# Patient Record
Sex: Male | Born: 1970 | Race: White | Hispanic: No | Marital: Married | Smoking: Never smoker
Health system: Southern US, Community
[De-identification: ages and names within clinical notes are randomized; demographics above are authoritative.]

## PROBLEM LIST (undated history)

## (undated) DIAGNOSIS — K219 Gastro-esophageal reflux disease without esophagitis: Secondary | ICD-10-CM

## (undated) DIAGNOSIS — L570 Actinic keratosis: Secondary | ICD-10-CM

## (undated) DIAGNOSIS — E291 Testicular hypofunction: Secondary | ICD-10-CM

## (undated) DIAGNOSIS — D649 Anemia, unspecified: Secondary | ICD-10-CM

## (undated) DIAGNOSIS — I10 Essential (primary) hypertension: Secondary | ICD-10-CM

## (undated) HISTORY — PX: TONSILLECTOMY: SHX5217

## (undated) HISTORY — DX: Gastro-esophageal reflux disease without esophagitis: K21.9

## (undated) HISTORY — DX: Actinic keratosis: L57.0

## (undated) HISTORY — DX: Testicular hypofunction: E29.1

## (undated) HISTORY — PX: TYMPANOSTOMY TUBE PLACEMENT: SHX32

## (undated) HISTORY — DX: Anemia, unspecified: D64.9

## (undated) HISTORY — DX: Essential (primary) hypertension: I10

---

## 2014-11-17 ENCOUNTER — Other Ambulatory Visit: Payer: Self-pay | Admitting: Family Medicine

## 2015-01-28 ENCOUNTER — Other Ambulatory Visit: Payer: Self-pay | Admitting: Family Medicine

## 2015-03-31 ENCOUNTER — Ambulatory Visit: Payer: Self-pay | Admitting: Family Medicine

## 2015-04-21 DIAGNOSIS — E291 Testicular hypofunction: Secondary | ICD-10-CM | POA: Insufficient documentation

## 2015-04-21 DIAGNOSIS — D649 Anemia, unspecified: Secondary | ICD-10-CM | POA: Insufficient documentation

## 2015-04-21 DIAGNOSIS — K219 Gastro-esophageal reflux disease without esophagitis: Secondary | ICD-10-CM | POA: Insufficient documentation

## 2015-04-21 DIAGNOSIS — I10 Essential (primary) hypertension: Secondary | ICD-10-CM | POA: Insufficient documentation

## 2015-04-23 ENCOUNTER — Ambulatory Visit (INDEPENDENT_AMBULATORY_CARE_PROVIDER_SITE_OTHER): Payer: 59 | Admitting: Family Medicine

## 2015-04-23 ENCOUNTER — Encounter: Payer: Self-pay | Admitting: Family Medicine

## 2015-04-23 VITALS — BP 121/64 | HR 65 | Temp 98.0°F | Ht 76.7 in | Wt 214.0 lb

## 2015-04-23 DIAGNOSIS — D649 Anemia, unspecified: Secondary | ICD-10-CM

## 2015-04-23 DIAGNOSIS — Z23 Encounter for immunization: Secondary | ICD-10-CM

## 2015-04-23 DIAGNOSIS — E291 Testicular hypofunction: Secondary | ICD-10-CM | POA: Diagnosis not present

## 2015-04-23 DIAGNOSIS — I10 Essential (primary) hypertension: Secondary | ICD-10-CM

## 2015-04-23 MED ORDER — FLUTICASONE PROPIONATE 50 MCG/ACT NA SUSP: 2.0000 | Freq: Every day | NASAL | Status: DC

## 2015-04-23 MED ORDER — TESTOSTERONE CYPIONATE 200 MG/ML IM SOLN: INTRAMUSCULAR | Status: DC

## 2015-04-23 MED ORDER — AMLODIPINE BESYLATE 5 MG PO TABS: 5.0000 mg | ORAL_TABLET | Freq: Every day | ORAL | Status: DC

## 2015-04-23 NOTE — Assessment & Plan Note (Signed)
The current medical regimen is effective;  continue present plan and medications.  

## 2015-04-23 NOTE — Progress Notes (Signed)
   BP 121/64 mmHg  Pulse 65  Temp(Src) 98 F (36.7 C)  Ht 6' 4.7" (1.948 m)  Wt 214 lb (97.07 kg)  BMI 25.58 kg/m2  SpO2 98%   Subjective:    Patient ID: Dustin [name], male    DOB: 1970-10-19, 44 y.o.   MRN: [id number]  HPI: Dustin [name] is a 44 y.o. male  Chief Complaint  Patient presents with  . Hypertension  . Hypogonadism   patient doing well good blood pressure control no complaints from medications takes faithfully Testosterone patient's doing self injections without problems taking 200 mg per mL every 2 weeks is controlling his symptoms well without problems or concerns  Relevant past medical, surgical, family and social history reviewed and updated as indicated. Interim medical history since our last visit reviewed. Allergies and medications reviewed and updated.  Review of Systems  Constitutional: Negative.   Respiratory: Negative.   Cardiovascular: Negative.     Per HPI unless specifically indicated above     Objective:    BP 121/64 mmHg  Pulse 65  Temp(Src) 98 F (36.7 C)  Ht 6' 4.7" (1.948 m)  Wt 214 lb (97.07 kg)  BMI 25.58 kg/m2  SpO2 98%  Wt Readings from Last 3 Encounters:  04/23/15 214 lb (97.07 kg)  09/23/14 216 lb (97.977 kg)    Physical Exam  Constitutional: He is oriented to person, place, and time. He appears well-developed and well-nourished. No distress.  HENT:  Head: Normocephalic and atraumatic.  Right Ear: Hearing normal.  Left Ear: Hearing normal.  Nose: Nose normal.  Eyes: Conjunctivae and lids are normal. Right eye exhibits no discharge. Left eye exhibits no discharge. No scleral icterus.  Cardiovascular: Normal rate and regular rhythm.   Pulmonary/Chest: Effort normal and breath sounds normal. No respiratory distress.  Musculoskeletal: Normal range of motion.  Neurological: He is alert and oriented to person, place, and time.  Skin: Skin is intact. No rash noted.  Psychiatric: He has a normal mood and affect. His speech is  normal and behavior is normal. Judgment and thought content normal. Cognition and memory are normal.    No results found for this or any previous visit.    Assessment & Plan:   Problem List Items Addressed This Visit      Cardiovascular and Mediastinum   Hypertension    The current medical regimen is effective;  continue present plan and medications.       Relevant Medications   amLODipine (NORVASC) 5 MG tablet     Endocrine   Male hypogonadism    The current medical regimen is effective;  continue present plan and medications.       Relevant Orders   PSA   Testosterone    Other Visit Diagnoses    Essential hypertension, benign    -  Primary    Relevant Medications    amLODipine (NORVASC) 5 MG tablet    Other Relevant Orders    Comprehensive metabolic panel    Anemia, unspecified        Relevant Orders    CBC with Differential/Platelet    Immunization due        Relevant Orders    Flu Vaccine QUAD 36+ mos PF IM (Fluarix & Fluzone Quad PF)        Follow up plan: Return in about 6 months (around 10/21/2015), or if symptoms worsen or fail to improve, for Physical Exam.

## 2015-04-24 LAB — COMPREHENSIVE METABOLIC PANEL
ALK PHOS: 43 IU/L (ref 39–117)
ALT: 27 IU/L (ref 0–44)
AST: 65 IU/L — AB (ref 0–40)
Albumin/Globulin Ratio: 1.8 (ref 1.1–2.5)
Albumin: 4.6 g/dL (ref 3.5–5.5)
BILIRUBIN TOTAL: 0.5 mg/dL (ref 0.0–1.2)
BUN/Creatinine Ratio: 10 (ref 9–20)
BUN: 15 mg/dL (ref 6–24)
CHLORIDE: 96 mmol/L — AB (ref 97–106)
CO2: 26 mmol/L (ref 18–29)
Calcium: 9.6 mg/dL (ref 8.7–10.2)
Creatinine, Ser: 1.45 mg/dL — ABNORMAL HIGH (ref 0.76–1.27)
GFR calc Af Amer: 68 mL/min/{1.73_m2} (ref >59)
GFR calc non Af Amer: 59 mL/min/{1.73_m2} — ABNORMAL LOW (ref >59)
GLUCOSE: 82 mg/dL (ref 65–99)
Globulin, Total: 2.5 g/dL (ref 1.5–4.5)
Potassium: 4.6 mmol/L (ref 3.5–5.2)
Sodium: 137 mmol/L (ref 136–144)
Total Protein: 7.1 g/dL (ref 6.0–8.5)

## 2015-04-24 LAB — CBC WITH DIFFERENTIAL/PLATELET
BASOS ABS: 0 10*3/uL (ref 0.0–0.2)
Basos: 0 %
EOS (ABSOLUTE): 0.2 10*3/uL (ref 0.0–0.4)
Eos: 4 %
HEMOGLOBIN: 15.1 g/dL (ref 12.6–17.7)
Hematocrit: 44.9 % (ref 37.5–51.0)
Immature Grans (Abs): 0 10*3/uL (ref 0.0–0.1)
Immature Granulocytes: 0 %
LYMPHS ABS: 1.9 10*3/uL (ref 0.7–3.1)
LYMPHS: 31 %
MCH: 29.4 pg (ref 26.6–33.0)
MCHC: 33.6 g/dL (ref 31.5–35.7)
MCV: 88 fL (ref 79–97)
Monocytes Absolute: 0.5 10*3/uL (ref 0.1–0.9)
Monocytes: 8 %
Neutrophils Absolute: 3.5 10*3/uL (ref 1.4–7.0)
Neutrophils: 57 %
PLATELETS: 221 10*3/uL (ref 150–379)
RBC: 5.13 x10E6/uL (ref 4.14–5.80)
RDW: 16.1 % — ABNORMAL HIGH (ref 12.3–15.4)
WBC: 6.1 10*3/uL (ref 3.4–10.8)

## 2015-04-24 LAB — TESTOSTERONE: TESTOSTERONE: 234 ng/dL — AB (ref 348–1197)

## 2015-04-24 LAB — PSA: PROSTATE SPECIFIC AG, SERUM: 0.4 ng/mL (ref 0.0–4.0)

## 2015-04-27 ENCOUNTER — Encounter: Payer: Self-pay | Admitting: Family Medicine

## 2015-04-28 ENCOUNTER — Telehealth: Payer: Self-pay | Admitting: Family Medicine

## 2015-04-28 NOTE — Telephone Encounter (Signed)
Called patient with results, he will get letter in the mail

## 2015-04-28 NOTE — Telephone Encounter (Signed)
Pt looking for lab results

## 2015-05-05 ENCOUNTER — Telehealth: Payer: Self-pay

## 2015-05-05 NOTE — Telephone Encounter (Signed)
Discussed with patient nothing to worry about.

## 2015-05-05 NOTE — Telephone Encounter (Signed)
Call pt 

## 2015-05-05 NOTE — Telephone Encounter (Signed)
Patient got his labs in the mail, he noticed his AST is elevated and wants to make sure there is nothing he needs to be doing to address this?  I check PP, and it has gone up from his last check in April , it was 62

## 2015-10-21 ENCOUNTER — Encounter: Payer: Self-pay | Admitting: Family Medicine

## 2015-10-21 ENCOUNTER — Ambulatory Visit (INDEPENDENT_AMBULATORY_CARE_PROVIDER_SITE_OTHER): Payer: 59 | Admitting: Family Medicine

## 2015-10-21 VITALS — BP 133/74 | HR 65 | Temp 98.4°F | Ht 77.3 in | Wt 217.0 lb

## 2015-10-21 DIAGNOSIS — Z0000 Encounter for general adult medical examination without abnormal findings: Secondary | ICD-10-CM

## 2015-10-21 DIAGNOSIS — I10 Essential (primary) hypertension: Secondary | ICD-10-CM

## 2015-10-21 DIAGNOSIS — K219 Gastro-esophageal reflux disease without esophagitis: Secondary | ICD-10-CM | POA: Diagnosis not present

## 2015-10-21 DIAGNOSIS — E291 Testicular hypofunction: Secondary | ICD-10-CM | POA: Diagnosis not present

## 2015-10-21 LAB — URINALYSIS, ROUTINE W REFLEX MICROSCOPIC
BILIRUBIN UA: NEGATIVE
Glucose, UA: NEGATIVE
KETONES UA: NEGATIVE
Leukocytes, UA: NEGATIVE
NITRITE UA: NEGATIVE
Protein, UA: NEGATIVE
RBC, UA: NEGATIVE
Specific Gravity, UA: <1.005 — ABNORMAL LOW (ref 1.005–1.030)
UUROB: 0.2 mg/dL (ref 0.2–1.0)
pH, UA: 5 (ref 5.0–7.5)

## 2015-10-21 MED ORDER — AMLODIPINE BESYLATE 5 MG PO TABS: 5.0000 mg | ORAL_TABLET | Freq: Every day | ORAL | Status: DC

## 2015-10-21 MED ORDER — TESTOSTERONE CYPIONATE 200 MG/ML IM SOLN: INTRAMUSCULAR | Status: DC

## 2015-10-21 NOTE — Assessment & Plan Note (Signed)
The current medical regimen is effective;  continue present plan and medications.  

## 2015-10-21 NOTE — Addendum Note (Signed)
Addended by: Rowe Clack H on: 10/21/2015 01:44 PM   Modules accepted: Miquel Dunn

## 2015-10-21 NOTE — Addendum Note (Signed)
Addended byGolden Pop on: 10/21/2015 01:44 PM   Modules accepted: Orders, SmartSet

## 2015-10-21 NOTE — Progress Notes (Signed)
BP 133/74 mmHg  Pulse 65  Temp(Src) 98.4 F (36.9 C)  Ht 6' 5.3" (1.963 m)  Wt 217 lb (98.431 kg)  BMI 25.54 kg/m2  SpO2 98%   Subjective:    Patient ID: Dustin [name], male    DOB: 06/29/1970, 45 y.o.   MRN: [id number]  HPI: Dustin [name] is a 45 y.o. male  Chief Complaint  Patient presents with  . Annual Exam  Patient's doing well with no complaints blood pressure good control no issues no side effects medications. Taking testosterone 1 cc 200 mg every 2 weeks with no issues self administers Nexium when necessary And Propecia Some left thumb soreness weakness continues real change over the last year. No numbness tingling  Relevant past medical, surgical, family and social history reviewed and updated as indicated. Interim medical history since our last visit reviewed. Allergies and medications reviewed and updated.  Review of Systems  Constitutional: Negative.   HENT: Negative.   Eyes: Negative.   Respiratory: Negative.   Cardiovascular: Negative.   Gastrointestinal: Negative.   Endocrine: Negative.   Genitourinary: Negative.   Musculoskeletal: Negative.   Skin: Negative.   Allergic/Immunologic: Negative.   Neurological: Negative.   Hematological: Negative.   Psychiatric/Behavioral: Negative.     Per HPI unless specifically indicated above     Objective:    BP 133/74 mmHg  Pulse 65  Temp(Src) 98.4 F (36.9 C)  Ht 6' 5.3" (1.963 m)  Wt 217 lb (98.431 kg)  BMI 25.54 kg/m2  SpO2 98%  Wt Readings from Last 3 Encounters:  10/21/15 217 lb (98.431 kg)  04/23/15 214 lb (97.07 kg)  09/23/14 216 lb (97.977 kg)    Physical Exam  Constitutional: He is oriented to person, place, and time. He appears well-developed and well-nourished.  HENT:  Head: Normocephalic and atraumatic.  Right Ear: External ear normal.  Left Ear: External ear normal.  Eyes: Conjunctivae and EOM are normal. Pupils are equal, round, and reactive to light.  Neck: Normal range of motion.  Neck supple.  Cardiovascular: Normal rate, regular rhythm, normal heart sounds and intact distal pulses.   Pulmonary/Chest: Effort normal and breath sounds normal.  Abdominal: Soft. Bowel sounds are normal. There is no splenomegaly or hepatomegaly.  Genitourinary: Rectum normal, prostate normal and penis normal.  Musculoskeletal: Normal range of motion.  Neurological: He is alert and oriented to person, place, and time. He has normal reflexes.  Skin: No rash noted. No erythema.  Psychiatric: He has a normal mood and affect. His behavior is normal. Judgment and thought content normal.    Results for orders placed or performed in visit on 04/23/15  CBC with Differential/Platelet  Result Value Ref Range   WBC 6.1 3.4 - 10.8 x10E3/uL   RBC 5.13 4.14 - 5.80 x10E6/uL   Hemoglobin 15.1 12.6 - 17.7 g/dL   Hematocrit 44.9 37.5 - 51.0 %   MCV 88 79 - 97 fL   MCH 29.4 26.6 - 33.0 pg   MCHC 33.6 31.5 - 35.7 g/dL   RDW 16.1 (H) 12.3 - 15.4 %   Platelets 221 150 - 379 x10E3/uL   Neutrophils 57 %   Lymphs 31 %   Monocytes 8 %   Eos 4 %   Basos 0 %   Neutrophils Absolute 3.5 1.4 - 7.0 x10E3/uL   Lymphocytes Absolute 1.9 0.7 - 3.1 x10E3/uL   Monocytes Absolute 0.5 0.1 - 0.9 x10E3/uL   EOS (ABSOLUTE) 0.2 0.0 - 0.4 x10E3/uL  Basophils Absolute 0.0 0.0 - 0.2 x10E3/uL   Immature Granulocytes 0 %   Immature Grans (Abs) 0.0 0.0 - 0.1 x10E3/uL  Comprehensive metabolic panel  Result Value Ref Range   Glucose 82 65 - 99 mg/dL   BUN 15 6 - 24 mg/dL   Creatinine, Ser 1.45 (H) 0.76 - 1.27 mg/dL   GFR calc non Af Amer 59 (L) >59 mL/min/1.73   GFR calc Af Amer 68 >59 mL/min/1.73   BUN/Creatinine Ratio 10 9 - 20   Sodium 137 136 - 144 mmol/L   Potassium 4.6 3.5 - 5.2 mmol/L   Chloride 96 (L) 97 - 106 mmol/L   CO2 26 18 - 29 mmol/L   Calcium 9.6 8.7 - 10.2 mg/dL   Total Protein 7.1 6.0 - 8.5 g/dL   Albumin 4.6 3.5 - 5.5 g/dL   Globulin, Total 2.5 1.5 - 4.5 g/dL   Albumin/Globulin Ratio 1.8 1.1 -  2.5   Bilirubin Total 0.5 0.0 - 1.2 mg/dL   Alkaline Phosphatase 43 39 - 117 IU/L   AST 65 (H) 0 - 40 IU/L   ALT 27 0 - 44 IU/L  PSA  Result Value Ref Range   Prostate Specific Ag, Serum 0.4 0.0 - 4.0 ng/mL  Testosterone  Result Value Ref Range   Testosterone 234 (L) 348 - 1197 ng/dL   Comment, Testosterone Comment       Assessment & Plan:   Problem List Items Addressed This Visit      Cardiovascular and Mediastinum   Hypertension    The current medical regimen is effective;  continue present plan and medications.       Relevant Medications   amLODipine (NORVASC) 5 MG tablet     Digestive   GERD (gastroesophageal reflux disease)    The current medical regimen is effective;  continue present plan and medications.         Endocrine   Male hypogonadism    Doing well on replacement with 200 mg every 2 weeks we'll recheck in testosterone today and patient at trough level. Will give next shots in 2 days.      Relevant Medications   testosterone cypionate (DEPOTESTOSTERONE CYPIONATE) 200 MG/ML injection   Other Relevant Orders   Testosterone    Other Visit Diagnoses    Routine general medical examination at a health care facility    -  Primary    Relevant Orders    CBC with Differential/Platelet    Comprehensive metabolic panel    Lipid Panel w/o Chol/HDL Ratio    PSA    TSH    Urinalysis, Routine w reflex microscopic (not at [facility])        Follow up plan: Return in about 6 months (around 04/22/2016) for  BMP, testoosterone, CBC, PSA.

## 2015-10-21 NOTE — Assessment & Plan Note (Signed)
Doing well on replacement with 200 mg every 2 weeks we'll recheck in testosterone today and patient at trough level. Will give next shots in 2 days.

## 2015-10-22 ENCOUNTER — Encounter: Payer: Self-pay | Admitting: Family Medicine

## 2015-10-22 LAB — COMPREHENSIVE METABOLIC PANEL
ALBUMIN: 4.5 g/dL (ref 3.5–5.5)
ALK PHOS: 44 IU/L (ref 39–117)
ALT: 30 IU/L (ref 0–44)
AST: 64 IU/L — ABNORMAL HIGH (ref 0–40)
Albumin/Globulin Ratio: 1.6 (ref 1.2–2.2)
BILIRUBIN TOTAL: 0.6 mg/dL (ref 0.0–1.2)
BUN / CREAT RATIO: 14 (ref 9–20)
BUN: 16 mg/dL (ref 6–24)
CHLORIDE: 100 mmol/L (ref 96–106)
CO2: 26 mmol/L (ref 18–29)
CREATININE: 1.16 mg/dL (ref 0.76–1.27)
Calcium: 9.2 mg/dL (ref 8.7–10.2)
GFR calc non Af Amer: 77 mL/min/{1.73_m2} (ref >59)
GFR, EST AFRICAN AMERICAN: 89 mL/min/{1.73_m2} (ref >59)
GLOBULIN, TOTAL: 2.8 g/dL (ref 1.5–4.5)
Glucose: 91 mg/dL (ref 65–99)
Potassium: 4.1 mmol/L (ref 3.5–5.2)
SODIUM: 140 mmol/L (ref 134–144)
TOTAL PROTEIN: 7.3 g/dL (ref 6.0–8.5)

## 2015-10-22 LAB — CBC WITH DIFFERENTIAL/PLATELET
Basophils Absolute: 0 10*3/uL (ref 0.0–0.2)
Basos: 0 %
EOS (ABSOLUTE): 0.3 10*3/uL (ref 0.0–0.4)
Eos: 5 %
HEMATOCRIT: 41.7 % (ref 37.5–51.0)
HEMOGLOBIN: 13.1 g/dL (ref 12.6–17.7)
IMMATURE GRANS (ABS): 0 10*3/uL (ref 0.0–0.1)
Immature Granulocytes: 0 %
LYMPHS: 24 %
Lymphocytes Absolute: 1.3 10*3/uL (ref 0.7–3.1)
MCH: 25.5 pg — AB (ref 26.6–33.0)
MCHC: 31.4 g/dL — AB (ref 31.5–35.7)
MCV: 81 fL (ref 79–97)
MONOS ABS: 0.3 10*3/uL (ref 0.1–0.9)
Monocytes: 5 %
NEUTROS PCT: 66 %
Neutrophils Absolute: 3.6 10*3/uL (ref 1.4–7.0)
Platelets: 269 10*3/uL (ref 150–379)
RBC: 5.14 x10E6/uL (ref 4.14–5.80)
RDW: 16.3 % — ABNORMAL HIGH (ref 12.3–15.4)
WBC: 5.5 10*3/uL (ref 3.4–10.8)

## 2015-10-22 LAB — TESTOSTERONE: Testosterone: 465 ng/dL (ref 348–1197)

## 2015-10-22 LAB — TSH: TSH: 1.39 u[IU]/mL (ref 0.450–4.500)

## 2015-10-22 LAB — PSA: Prostate Specific Ag, Serum: 0.2 ng/mL (ref 0.0–4.0)

## 2015-10-22 LAB — LIPID PANEL W/O CHOL/HDL RATIO
CHOLESTEROL TOTAL: 176 mg/dL (ref 100–199)
HDL: 51 mg/dL (ref >39)
LDL CALC: 113 mg/dL — AB (ref 0–99)
Triglycerides: 59 mg/dL (ref 0–149)
VLDL CHOLESTEROL CAL: 12 mg/dL (ref 5–40)

## 2016-04-26 ENCOUNTER — Ambulatory Visit (INDEPENDENT_AMBULATORY_CARE_PROVIDER_SITE_OTHER): Payer: 59 | Admitting: Family Medicine

## 2016-04-26 ENCOUNTER — Encounter: Payer: Self-pay | Admitting: Family Medicine

## 2016-04-26 VITALS — BP 146/88 | HR 74 | Temp 98.5°F | Wt 231.0 lb

## 2016-04-26 DIAGNOSIS — I10 Essential (primary) hypertension: Secondary | ICD-10-CM | POA: Diagnosis not present

## 2016-04-26 DIAGNOSIS — E291 Testicular hypofunction: Secondary | ICD-10-CM | POA: Diagnosis not present

## 2016-04-26 DIAGNOSIS — Z23 Encounter for immunization: Secondary | ICD-10-CM | POA: Diagnosis not present

## 2016-04-26 DIAGNOSIS — M722 Plantar fascial fibromatosis: Secondary | ICD-10-CM | POA: Diagnosis not present

## 2016-04-26 MED ORDER — MELOXICAM 15 MG PO TABS: 15.0000 mg | ORAL_TABLET | Freq: Every day | ORAL | 3 refills | Status: DC

## 2016-04-26 MED ORDER — TESTOSTERONE CYPIONATE 200 MG/ML IM SOLN
INTRAMUSCULAR | 1 refills | Status: DC
Start: 2016-04-26 — End: 2016-11-10

## 2016-04-26 NOTE — Assessment & Plan Note (Signed)
Poor control patient will do better with diet nutrition exercise if not staying down we'll notify us for further care and treatment.

## 2016-04-26 NOTE — Progress Notes (Signed)
BP (!) 146/88   Pulse 74   Temp 98.5 F (36.9 C)   Wt 231 lb (104.8 kg)   SpO2 99%   BMI 27.18 kg/m    Subjective:    Patient ID: Dustin [name], male    DOB: 09-21-1970, 45 y.o.   MRN: [id number]  HPI: Dustin [name] is a 45 y.o. male  Chief Complaint  Patient presents with  . Hypertension  . Low Testosterone  Patient has liberalized his diet somewhat with more salt taking medications faithfully without side effects. Blood pressures been elevated on home readings also Patient's also developed plantar fasciitis classic signs and symptoms Doing well with testosterone replacement gave shot 3 days ago so will be measuring peak testosterone this time  Relevant past medical, surgical, family and social history reviewed and updated as indicated. Interim medical history since our last visit reviewed. Allergies and medications reviewed and updated.  Review of Systems  Constitutional: Negative.   Respiratory: Negative.   Cardiovascular: Negative.     Per HPI unless specifically indicated above     Objective:    BP (!) 146/88   Pulse 74   Temp 98.5 F (36.9 C)   Wt 231 lb (104.8 kg)   SpO2 99%   BMI 27.18 kg/m   Wt Readings from Last 3 Encounters:  04/26/16 231 lb (104.8 kg)  10/21/15 217 lb (98.4 kg)  04/23/15 214 lb (97.1 kg)    Physical Exam  Constitutional: He is oriented to person, place, and time. He appears well-developed and well-nourished. No distress.  HENT:  Head: Normocephalic and atraumatic.  Right Ear: Hearing normal.  Left Ear: Hearing normal.  Nose: Nose normal.  Eyes: Conjunctivae and lids are normal. Right eye exhibits no discharge. Left eye exhibits no discharge. No scleral icterus.  Cardiovascular: Normal rate, regular rhythm and normal heart sounds.   Pulmonary/Chest: Effort normal and breath sounds normal. No respiratory distress.  Musculoskeletal: Normal range of motion.  Neurological: He is alert and oriented to person, place, and time.    Skin: Skin is intact. No rash noted.  Psychiatric: He has a normal mood and affect. His speech is normal and behavior is normal. Judgment and thought content normal. Cognition and memory are normal.    Results for orders placed or performed in visit on 10/21/15  CBC with Differential/Platelet  Result Value Ref Range   WBC 5.5 3.4 - 10.8 x10E3/uL   RBC 5.14 4.14 - 5.80 x10E6/uL   Hemoglobin 13.1 12.6 - 17.7 g/dL   Hematocrit 41.7 37.5 - 51.0 %   MCV 81 79 - 97 fL   MCH 25.5 (L) 26.6 - 33.0 pg   MCHC 31.4 (L) 31.5 - 35.7 g/dL   RDW 16.3 (H) 12.3 - 15.4 %   Platelets 269 150 - 379 x10E3/uL   Neutrophils 66 %   Lymphs 24 %   Monocytes 5 %   Eos 5 %   Basos 0 %   Neutrophils Absolute 3.6 1.4 - 7.0 x10E3/uL   Lymphocytes Absolute 1.3 0.7 - 3.1 x10E3/uL   Monocytes Absolute 0.3 0.1 - 0.9 x10E3/uL   EOS (ABSOLUTE) 0.3 0.0 - 0.4 x10E3/uL   Basophils Absolute 0.0 0.0 - 0.2 x10E3/uL   Immature Granulocytes 0 %   Immature Grans (Abs) 0.0 0.0 - 0.1 x10E3/uL  Comprehensive metabolic panel  Result Value Ref Range   Glucose 91 65 - 99 mg/dL   BUN 16 6 - 24 mg/dL   Creatinine, Ser 1.16  0.76 - 1.27 mg/dL   GFR calc non Af Amer 77 >59 mL/min/1.73   GFR calc Af Amer 89 >59 mL/min/1.73   BUN/Creatinine Ratio 14 9 - 20   Sodium 140 134 - 144 mmol/L   Potassium 4.1 3.5 - 5.2 mmol/L   Chloride 100 96 - 106 mmol/L   CO2 26 18 - 29 mmol/L   Calcium 9.2 8.7 - 10.2 mg/dL   Total Protein 7.3 6.0 - 8.5 g/dL   Albumin 4.5 3.5 - 5.5 g/dL   Globulin, Total 2.8 1.5 - 4.5 g/dL   Albumin/Globulin Ratio 1.6 1.2 - 2.2   Bilirubin Total 0.6 0.0 - 1.2 mg/dL   Alkaline Phosphatase 44 39 - 117 IU/L   AST 64 (H) 0 - 40 IU/L   ALT 30 0 - 44 IU/L  Lipid Panel w/o Chol/HDL Ratio  Result Value Ref Range   Cholesterol, Total 176 100 - 199 mg/dL   Triglycerides 59 0 - 149 mg/dL   HDL 51 >39 mg/dL   VLDL Cholesterol Cal 12 5 - 40 mg/dL   LDL Calculated 113 (H) 0 - 99 mg/dL  PSA  Result Value Ref Range    Prostate Specific Ag, Serum 0.2 0.0 - 4.0 ng/mL  TSH  Result Value Ref Range   TSH 1.390 0.450 - 4.500 uIU/mL  Urinalysis, Routine w reflex microscopic (not at [facility])  Result Value Ref Range   Specific Gravity, UA <1.005 (L) 1.005 - 1.030   pH, UA 5.0 5.0 - 7.5   Color, UA Yellow Yellow   Appearance Ur Clear Clear   Leukocytes, UA Negative Negative   Protein, UA Negative Negative/Trace   Glucose, UA Negative Negative   Ketones, UA Negative Negative   RBC, UA Negative Negative   Bilirubin, UA Negative Negative   Urobilinogen, Ur 0.2 0.2 - 1.0 mg/dL   Nitrite, UA Negative Negative  Testosterone  Result Value Ref Range   Testosterone 465 348 - 1,197 ng/dL   Comment, Testosterone Comment       Assessment & Plan:   Problem List Items Addressed This Visit      Cardiovascular and Mediastinum   Hypertension - Primary    Poor control patient will do better with diet nutrition exercise if not staying down we'll notify us for further care and treatment.      Relevant Orders   Basic metabolic panel     Endocrine   Male hypogonadism    Checking peak labs today. Otherwise doing well.      Relevant Medications   testosterone cypionate (DEPOTESTOSTERONE CYPIONATE) 200 MG/ML injection   Other Relevant Orders   Testosterone, Free, Total, SHBG   CBC with Differential/Platelet     Musculoskeletal and Integument   Plantar fasciitis of right foot    Discussed care and treatment of R fasciitis not going barefooted shoes padding use of medication stretching rolling       Other Visit Diagnoses    Needs flu shot       Encounter for immunization       Relevant Orders   Flu Vaccine QUAD 36+ mos IM (Completed)       Follow up plan: Return in about 6 months (around 10/24/2016) for Physical Exam testosterone.

## 2016-04-26 NOTE — Assessment & Plan Note (Signed)
Discussed care and treatment of R fasciitis not going barefooted shoes padding use of medication stretching rolling

## 2016-04-26 NOTE — Assessment & Plan Note (Signed)
Checking peak labs today. Otherwise doing well.

## 2016-04-27 LAB — BASIC METABOLIC PANEL
BUN/Creatinine Ratio: 16 (ref 9–20)
BUN: 19 mg/dL (ref 6–24)
CO2: 23 mmol/L (ref 18–29)
Calcium: 10.2 mg/dL (ref 8.7–10.2)
Chloride: 100 mmol/L (ref 96–106)
Creatinine, Ser: 1.22 mg/dL (ref 0.76–1.27)
GFR, EST AFRICAN AMERICAN: 83 mL/min/{1.73_m2} (ref >59)
GFR, EST NON AFRICAN AMERICAN: 72 mL/min/{1.73_m2} (ref >59)
Glucose: 74 mg/dL (ref 65–99)
POTASSIUM: 4.3 mmol/L (ref 3.5–5.2)
SODIUM: 145 mmol/L — AB (ref 134–144)

## 2016-04-27 LAB — CBC WITH DIFFERENTIAL/PLATELET
BASOS: 0 %
Basophils Absolute: 0 10*3/uL (ref 0.0–0.2)
EOS (ABSOLUTE): 0.4 10*3/uL (ref 0.0–0.4)
Eos: 5 %
HEMATOCRIT: 43.7 % (ref 37.5–51.0)
HEMOGLOBIN: 14 g/dL (ref 12.6–17.7)
IMMATURE GRANS (ABS): 0 10*3/uL (ref 0.0–0.1)
Immature Granulocytes: 0 %
LYMPHS: 22 %
Lymphocytes Absolute: 1.6 10*3/uL (ref 0.7–3.1)
MCH: 26 pg — AB (ref 26.6–33.0)
MCHC: 32 g/dL (ref 31.5–35.7)
MCV: 81 fL (ref 79–97)
MONOCYTES: 10 %
Monocytes Absolute: 0.7 10*3/uL (ref 0.1–0.9)
NEUTROS ABS: 4.6 10*3/uL (ref 1.4–7.0)
Neutrophils: 63 %
Platelets: 274 10*3/uL (ref 150–379)
RBC: 5.38 x10E6/uL (ref 4.14–5.80)
RDW: 16.4 % — ABNORMAL HIGH (ref 12.3–15.4)
WBC: 7.2 10*3/uL (ref 3.4–10.8)

## 2016-04-27 LAB — TESTOSTERONE, FREE, TOTAL, SHBG
Sex Hormone Binding: 29.9 nmol/L (ref 16.5–55.9)
TESTOSTERONE FREE: 27.9 pg/mL — AB (ref 6.8–21.5)
Testosterone: 1210 ng/dL — ABNORMAL HIGH (ref 264–916)

## 2016-04-28 ENCOUNTER — Encounter: Payer: Self-pay | Admitting: Family Medicine

## 2016-06-20 ENCOUNTER — Other Ambulatory Visit: Payer: Self-pay | Admitting: Family Medicine

## 2016-10-28 ENCOUNTER — Other Ambulatory Visit: Payer: Self-pay | Admitting: Family Medicine

## 2016-10-28 DIAGNOSIS — I10 Essential (primary) hypertension: Secondary | ICD-10-CM

## 2016-10-28 NOTE — Telephone Encounter (Signed)
Last OV: 04/26/16 Next OV: 11/10/16  BMP Latest Ref Rng & Units 04/26/2016 10/21/2015 04/23/2015  Glucose 65 - 99 mg/dL 74 91 82  BUN 6 - 24 mg/dL 19 16 15   Creatinine 0.76 - 1.27 mg/dL 1.22 1.16 1.45(H)  BUN/Creat Ratio 9 - 20 16 14 10   Sodium 134 - 144 mmol/L 145(H) 140 137  Potassium 3.5 - 5.2 mmol/L 4.3 4.1 4.6  Chloride 96 - 106 mmol/L 100 100 96(L)  CO2 18 - 29 mmol/L 23 26 26   Calcium 8.7 - 10.2 mg/dL 10.2 9.2 9.6

## 2016-11-10 ENCOUNTER — Encounter: Payer: Self-pay | Admitting: Family Medicine

## 2016-11-10 ENCOUNTER — Ambulatory Visit (INDEPENDENT_AMBULATORY_CARE_PROVIDER_SITE_OTHER): Payer: 59 | Admitting: Family Medicine

## 2016-11-10 VITALS — BP 133/80 | HR 69 | Ht 77.95 in | Wt 221.0 lb

## 2016-11-10 DIAGNOSIS — D649 Anemia, unspecified: Secondary | ICD-10-CM | POA: Diagnosis not present

## 2016-11-10 DIAGNOSIS — Z13220 Encounter for screening for lipoid disorders: Secondary | ICD-10-CM | POA: Diagnosis not present

## 2016-11-10 DIAGNOSIS — I10 Essential (primary) hypertension: Secondary | ICD-10-CM | POA: Diagnosis not present

## 2016-11-10 DIAGNOSIS — Z0000 Encounter for general adult medical examination without abnormal findings: Secondary | ICD-10-CM | POA: Diagnosis not present

## 2016-11-10 DIAGNOSIS — Z125 Encounter for screening for malignant neoplasm of prostate: Secondary | ICD-10-CM | POA: Diagnosis not present

## 2016-11-10 DIAGNOSIS — Z1329 Encounter for screening for other suspected endocrine disorder: Secondary | ICD-10-CM

## 2016-11-10 DIAGNOSIS — E291 Testicular hypofunction: Secondary | ICD-10-CM

## 2016-11-10 LAB — URINALYSIS, ROUTINE W REFLEX MICROSCOPIC
BILIRUBIN UA: NEGATIVE
Glucose, UA: NEGATIVE
Ketones, UA: NEGATIVE
Leukocytes, UA: NEGATIVE
NITRITE UA: NEGATIVE
PH UA: 6 (ref 5.0–7.5)
Protein, UA: NEGATIVE
RBC UA: NEGATIVE
SPEC GRAV UA: 1.015 (ref 1.005–1.030)
Urobilinogen, Ur: 0.2 mg/dL (ref 0.2–1.0)

## 2016-11-10 MED ORDER — TESTOSTERONE CYPIONATE 200 MG/ML IM SOLN: INTRAMUSCULAR | 1 refills | Status: DC

## 2016-11-10 MED ORDER — AMLODIPINE BESYLATE 5 MG PO TABS: 5.0000 mg | ORAL_TABLET | Freq: Every day | ORAL | 4 refills | Status: DC

## 2016-11-10 MED ORDER — FLUTICASONE PROPIONATE 50 MCG/ACT NA SUSP: 2.0000 | Freq: Every day | NASAL | 12 refills | Status: DC

## 2016-11-10 NOTE — Progress Notes (Signed)
BP 133/80   Pulse 69   Ht 6' 5.95" (1.98 m)   Wt 221 lb (100.2 kg)   SpO2 98%   BMI 25.57 kg/m    Subjective:    Patient ID: Dustin [name], male    DOB: 04-Jun-1971, 46 y.o.   MRN: [id number]  HPI: Dustin [name] is a 46 y.o. male  Annual exam  Doing testosterone replacement just took self injection 2 days ago. Doing well with Propecia and Flonase also.  Relevant past medical, surgical, family and social history reviewed and updated as indicated. Interim medical history since our last visit reviewed. Allergies and medications reviewed and updated.  Review of Systems  Constitutional: Negative.   HENT: Negative.   Eyes: Negative.   Respiratory: Negative.   Cardiovascular: Negative.   Gastrointestinal: Negative.   Endocrine: Negative.   Genitourinary: Negative.   Musculoskeletal: Negative.   Skin: Negative.   Allergic/Immunologic: Negative.   Neurological: Negative.   Hematological: Negative.   Psychiatric/Behavioral: Negative.     Per HPI unless specifically indicated above     Objective:    BP 133/80   Pulse 69   Ht 6' 5.95" (1.98 m)   Wt 221 lb (100.2 kg)   SpO2 98%   BMI 25.57 kg/m   Wt Readings from Last 3 Encounters:  11/10/16 221 lb (100.2 kg)  04/26/16 231 lb (104.8 kg)  10/21/15 217 lb (98.4 kg)    Physical Exam  Constitutional: He is oriented to person, place, and time. He appears well-developed and well-nourished.  HENT:  Head: Normocephalic and atraumatic.  Right Ear: External ear normal.  Left Ear: External ear normal.  Eyes: Conjunctivae and EOM are normal. Pupils are equal, round, and reactive to light.  Neck: Normal range of motion. Neck supple.  Cardiovascular: Normal rate, regular rhythm, normal heart sounds and intact distal pulses.   Pulmonary/Chest: Effort normal and breath sounds normal.  Abdominal: Soft. Bowel sounds are normal. There is no splenomegaly or hepatomegaly.  Genitourinary: Rectum normal, prostate normal and penis  normal.  Musculoskeletal: Normal range of motion.  Neurological: He is alert and oriented to person, place, and time. He has normal reflexes.  Skin: No rash noted. No erythema.  Psychiatric: He has a normal mood and affect. His behavior is normal. Judgment and thought content normal.    Results for orders placed or performed in visit on 96/22/29  Basic metabolic panel  Result Value Ref Range   Glucose 74 65 - 99 mg/dL   BUN 19 6 - 24 mg/dL   Creatinine, Ser 1.22 0.76 - 1.27 mg/dL   GFR calc non Af Amer 72 >59 mL/min/1.73   GFR calc Af Amer 83 >59 mL/min/1.73   BUN/Creatinine Ratio 16 9 - 20   Sodium 145 (H) 134 - 144 mmol/L   Potassium 4.3 3.5 - 5.2 mmol/L   Chloride 100 96 - 106 mmol/L   CO2 23 18 - 29 mmol/L   Calcium 10.2 8.7 - 10.2 mg/dL  Testosterone, Free, Total, SHBG  Result Value Ref Range   Testosterone 1,210 (H) 264 - 916 ng/dL   Testosterone, Free 27.9 (H) 6.8 - 21.5 pg/mL   Sex Hormone Binding 29.9 16.5 - 55.9 nmol/L  CBC with Differential/Platelet  Result Value Ref Range   WBC 7.2 3.4 - 10.8 x10E3/uL   RBC 5.38 4.14 - 5.80 x10E6/uL   Hemoglobin 14.0 12.6 - 17.7 g/dL   Hematocrit 43.7 37.5 - 51.0 %   MCV 81 79 -  97 fL   MCH 26.0 (L) 26.6 - 33.0 pg   MCHC 32.0 31.5 - 35.7 g/dL   RDW 16.4 (H) 12.3 - 15.4 %   Platelets 274 150 - 379 x10E3/uL   Neutrophils 63 Not Estab. %   Lymphs 22 Not Estab. %   Monocytes 10 Not Estab. %   Eos 5 Not Estab. %   Basos 0 Not Estab. %   Neutrophils Absolute 4.6 1.4 - 7.0 x10E3/uL   Lymphocytes Absolute 1.6 0.7 - 3.1 x10E3/uL   Monocytes Absolute 0.7 0.1 - 0.9 x10E3/uL   EOS (ABSOLUTE) 0.4 0.0 - 0.4 x10E3/uL   Basophils Absolute 0.0 0.0 - 0.2 x10E3/uL   Immature Granulocytes 0 Not Estab. %   Immature Grans (Abs) 0.0 0.0 - 0.1 x10E3/uL      Assessment & Plan:   Problem List Items Addressed This Visit      Cardiovascular and Mediastinum   Hypertension   Relevant Medications   amLODipine (NORVASC) 5 MG tablet   Other  Relevant Orders   Comprehensive metabolic panel   Urinalysis, Routine w reflex microscopic     Endocrine   Male hypogonadism   Relevant Medications   testosterone cypionate (DEPOTESTOSTERONE CYPIONATE) 200 MG/ML injection   Other Relevant Orders   Testosterone, free, total     Other   Anemia   Relevant Orders   CBC with Differential/Platelet    Other Visit Diagnoses    Annual physical exam    -  Primary   Relevant Orders   CBC with Differential/Platelet   Comprehensive metabolic panel   Lipid panel   PSA   TSH   Urinalysis, Routine w reflex microscopic   Screening cholesterol level       Relevant Orders   Lipid panel   Prostate cancer screening       Relevant Orders   PSA   Thyroid disorder screen       Relevant Orders   TSH       Follow up plan: Return for CBC, Testosterone, PSA.

## 2016-11-11 LAB — COMPREHENSIVE METABOLIC PANEL
A/G RATIO: 1.5 (ref 1.2–2.2)
ALBUMIN: 4.3 g/dL (ref 3.5–5.5)
ALK PHOS: 39 IU/L (ref 39–117)
ALT: 24 IU/L (ref 0–44)
AST: 54 IU/L — ABNORMAL HIGH (ref 0–40)
BUN/Creatinine Ratio: 18 (ref 9–20)
BUN: 25 mg/dL — ABNORMAL HIGH (ref 6–24)
Bilirubin Total: 0.4 mg/dL (ref 0.0–1.2)
CO2: 24 mmol/L (ref 18–29)
CREATININE: 1.36 mg/dL — AB (ref 0.76–1.27)
Calcium: 8.9 mg/dL (ref 8.7–10.2)
Chloride: 97 mmol/L (ref 96–106)
GFR calc Af Amer: 73 mL/min/{1.73_m2} (ref >59)
GFR calc non Af Amer: 63 mL/min/{1.73_m2} (ref >59)
GLOBULIN, TOTAL: 2.8 g/dL (ref 1.5–4.5)
Glucose: 81 mg/dL (ref 65–99)
POTASSIUM: 4 mmol/L (ref 3.5–5.2)
SODIUM: 136 mmol/L (ref 134–144)
Total Protein: 7.1 g/dL (ref 6.0–8.5)

## 2016-11-11 LAB — CBC WITH DIFFERENTIAL/PLATELET
BASOS: 0 %
Basophils Absolute: 0 10*3/uL (ref 0.0–0.2)
EOS (ABSOLUTE): 0.2 10*3/uL (ref 0.0–0.4)
EOS: 3 %
HEMATOCRIT: 42.7 % (ref 37.5–51.0)
HEMOGLOBIN: 13.7 g/dL (ref 13.0–17.7)
Immature Grans (Abs): 0 10*3/uL (ref 0.0–0.1)
Immature Granulocytes: 0 %
LYMPHS ABS: 1.3 10*3/uL (ref 0.7–3.1)
Lymphs: 24 %
MCH: 27.2 pg (ref 26.6–33.0)
MCHC: 32.1 g/dL (ref 31.5–35.7)
MCV: 85 fL (ref 79–97)
MONOCYTES: 9 %
MONOS ABS: 0.5 10*3/uL (ref 0.1–0.9)
NEUTROS ABS: 3.4 10*3/uL (ref 1.4–7.0)
Neutrophils: 64 %
Platelets: 241 10*3/uL (ref 150–379)
RBC: 5.04 x10E6/uL (ref 4.14–5.80)
RDW: 15.6 % — ABNORMAL HIGH (ref 12.3–15.4)
WBC: 5.3 10*3/uL (ref 3.4–10.8)

## 2016-11-11 LAB — TSH: TSH: 2.27 u[IU]/mL (ref 0.450–4.500)

## 2016-11-11 LAB — LIPID PANEL
CHOLESTEROL TOTAL: 162 mg/dL (ref 100–199)
Chol/HDL Ratio: 3.5 ratio (ref 0.0–5.0)
HDL: 46 mg/dL (ref >39)
LDL CALC: 103 mg/dL — AB (ref 0–99)
Triglycerides: 65 mg/dL (ref 0–149)
VLDL Cholesterol Cal: 13 mg/dL (ref 5–40)

## 2016-11-11 LAB — TESTOSTERONE, FREE, TOTAL, SHBG
Sex Hormone Binding: 39.9 nmol/L (ref 16.5–55.9)
TESTOSTERONE FREE: 12.9 pg/mL (ref 6.8–21.5)
Testosterone: 651 ng/dL (ref 264–916)

## 2016-11-11 LAB — PSA: Prostate Specific Ag, Serum: 0.3 ng/mL (ref 0.0–4.0)

## 2016-11-16 ENCOUNTER — Encounter: Payer: Self-pay | Admitting: Family Medicine

## 2017-05-16 ENCOUNTER — Ambulatory Visit: Payer: 59 | Admitting: Family Medicine

## 2017-05-16 ENCOUNTER — Encounter: Payer: Self-pay | Admitting: Family Medicine

## 2017-05-16 VITALS — BP 138/86 | HR 58 | Wt 225.0 lb

## 2017-05-16 DIAGNOSIS — E291 Testicular hypofunction: Secondary | ICD-10-CM

## 2017-05-16 DIAGNOSIS — M722 Plantar fascial fibromatosis: Secondary | ICD-10-CM | POA: Diagnosis not present

## 2017-05-16 DIAGNOSIS — I10 Essential (primary) hypertension: Secondary | ICD-10-CM

## 2017-05-16 NOTE — Assessment & Plan Note (Signed)
The current medical regimen is effective;  continue present plan and medications.  

## 2017-05-16 NOTE — Assessment & Plan Note (Signed)
A lot better

## 2017-05-16 NOTE — Progress Notes (Signed)
BP 138/86 (BP Location: Left Arm)   Pulse (!) 58   Wt 225 lb (102.1 kg)   SpO2 99%   BMI 26.03 kg/m    Subjective:    Patient ID: Dustin [name], male    DOB: 1971-03-07, 46 y.o.   MRN: [id number]  HPI: Dustin [name] is a 46 y.o. male  BP and testosterone check Last testosterone injection was 5 days ago. Patient self injects every 2 weeks without problems. Takes amlodipine for blood pressure without problems Also uses Flonase without problems.Blood pressure home checking indicating good control.  Relevant past medical, surgical, family and social history reviewed and updated as indicated. Interim medical history since our last visit reviewed. Allergies and medications reviewed and updated.  Review of Systems  Constitutional: Negative.   Respiratory: Negative.   Cardiovascular: Negative.     Per HPI unless specifically indicated above     Objective:    BP 138/86 (BP Location: Left Arm)   Pulse (!) 58   Wt 225 lb (102.1 kg)   SpO2 99%   BMI 26.03 kg/m   Wt Readings from Last 3 Encounters:  05/16/17 225 lb (102.1 kg)  11/10/16 221 lb (100.2 kg)  04/26/16 231 lb (104.8 kg)    Physical Exam  Constitutional: He is oriented to person, place, and time. He appears well-developed and well-nourished.  HENT:  Head: Normocephalic and atraumatic.  Eyes: Conjunctivae and EOM are normal.  Neck: Normal range of motion.  Cardiovascular: Normal rate, regular rhythm and normal heart sounds.  Pulmonary/Chest: Effort normal and breath sounds normal.  Musculoskeletal: Normal range of motion.  Neurological: He is alert and oriented to person, place, and time.  Skin: No erythema.  Psychiatric: He has a normal mood and affect. His behavior is normal. Judgment and thought content normal.    Results for orders placed or performed in visit on 11/10/16  Testosterone, free, total  Result Value Ref Range   Testosterone 651 264 - 916 ng/dL   Testosterone, Free 12.9 6.8 - 21.5 pg/mL   Sex  Hormone Binding 39.9 16.5 - 55.9 nmol/L  CBC with Differential/Platelet  Result Value Ref Range   WBC 5.3 3.4 - 10.8 x10E3/uL   RBC 5.04 4.14 - 5.80 x10E6/uL   Hemoglobin 13.7 13.0 - 17.7 g/dL   Hematocrit 42.7 37.5 - 51.0 %   MCV 85 79 - 97 fL   MCH 27.2 26.6 - 33.0 pg   MCHC 32.1 31.5 - 35.7 g/dL   RDW 15.6 (H) 12.3 - 15.4 %   Platelets 241 150 - 379 x10E3/uL   Neutrophils 64 Not Estab. %   Lymphs 24 Not Estab. %   Monocytes 9 Not Estab. %   Eos 3 Not Estab. %   Basos 0 Not Estab. %   Neutrophils Absolute 3.4 1.4 - 7.0 x10E3/uL   Lymphocytes Absolute 1.3 0.7 - 3.1 x10E3/uL   Monocytes Absolute 0.5 0.1 - 0.9 x10E3/uL   EOS (ABSOLUTE) 0.2 0.0 - 0.4 x10E3/uL   Basophils Absolute 0.0 0.0 - 0.2 x10E3/uL   Immature Granulocytes 0 Not Estab. %   Immature Grans (Abs) 0.0 0.0 - 0.1 x10E3/uL  Comprehensive metabolic panel  Result Value Ref Range   Glucose 81 65 - 99 mg/dL   BUN 25 (H) 6 - 24 mg/dL   Creatinine, Ser 1.36 (H) 0.76 - 1.27 mg/dL   GFR calc non Af Amer 63 >59 mL/min/1.73   GFR calc Af Amer 73 >59 mL/min/1.73   BUN/Creatinine  Ratio 18 9 - 20   Sodium 136 134 - 144 mmol/L   Potassium 4.0 3.5 - 5.2 mmol/L   Chloride 97 96 - 106 mmol/L   CO2 24 18 - 29 mmol/L   Calcium 8.9 8.7 - 10.2 mg/dL   Total Protein 7.1 6.0 - 8.5 g/dL   Albumin 4.3 3.5 - 5.5 g/dL   Globulin, Total 2.8 1.5 - 4.5 g/dL   Albumin/Globulin Ratio 1.5 1.2 - 2.2   Bilirubin Total 0.4 0.0 - 1.2 mg/dL   Alkaline Phosphatase 39 39 - 117 IU/L   AST 54 (H) 0 - 40 IU/L   ALT 24 0 - 44 IU/L  Lipid panel  Result Value Ref Range   Cholesterol, Total 162 100 - 199 mg/dL   Triglycerides 65 0 - 149 mg/dL   HDL 46 >39 mg/dL   VLDL Cholesterol Cal 13 5 - 40 mg/dL   LDL Calculated 103 (H) 0 - 99 mg/dL   Chol/HDL Ratio 3.5 0.0 - 5.0 ratio  PSA  Result Value Ref Range   Prostate Specific Ag, Serum 0.3 0.0 - 4.0 ng/mL  TSH  Result Value Ref Range   TSH 2.270 0.450 - 4.500 uIU/mL  Urinalysis, Routine w reflex  microscopic  Result Value Ref Range   Specific Gravity, UA 1.015 1.005 - 1.030   pH, UA 6.0 5.0 - 7.5   Color, UA Yellow Yellow   Appearance Ur Clear Clear   Leukocytes, UA Negative Negative   Protein, UA Negative Negative/Trace   Glucose, UA Negative Negative   Ketones, UA Negative Negative   RBC, UA Negative Negative   Bilirubin, UA Negative Negative   Urobilinogen, Ur 0.2 0.2 - 1.0 mg/dL   Nitrite, UA Negative Negative      Assessment & Plan:   Problem List Items Addressed This Visit      Cardiovascular and Mediastinum   Hypertension - Primary    The current medical regimen is effective;  continue present plan and medications.       Relevant Orders   CBC w/Diff     Endocrine   Male hypogonadism    The current medical regimen is effective;  continue present plan and medications.       Relevant Orders   Testosterone   PSA     Musculoskeletal and Integument   Plantar fasciitis of right foot    A lot better          Follow up plan: Return in about 6 months (around 11/14/2017) for Physical Exam and testosterone.

## 2017-05-17 ENCOUNTER — Encounter: Payer: Self-pay | Admitting: Family Medicine

## 2017-05-17 LAB — CBC WITH DIFFERENTIAL/PLATELET
BASOS ABS: 0 10*3/uL (ref 0.0–0.2)
Basos: 0 %
EOS (ABSOLUTE): 0.5 10*3/uL — ABNORMAL HIGH (ref 0.0–0.4)
EOS: 8 %
HEMATOCRIT: 43.8 % (ref 37.5–51.0)
HEMOGLOBIN: 14.1 g/dL (ref 13.0–17.7)
IMMATURE GRANS (ABS): 0 10*3/uL (ref 0.0–0.1)
Immature Granulocytes: 0 %
LYMPHS: 22 %
Lymphocytes Absolute: 1.3 10*3/uL (ref 0.7–3.1)
MCH: 26.9 pg (ref 26.6–33.0)
MCHC: 32.2 g/dL (ref 31.5–35.7)
MCV: 84 fL (ref 79–97)
MONOCYTES: 7 %
Monocytes Absolute: 0.4 10*3/uL (ref 0.1–0.9)
Neutrophils Absolute: 3.7 10*3/uL (ref 1.4–7.0)
Neutrophils: 63 %
Platelets: 242 10*3/uL (ref 150–379)
RBC: 5.24 x10E6/uL (ref 4.14–5.80)
RDW: 16.1 % — ABNORMAL HIGH (ref 12.3–15.4)
WBC: 5.9 10*3/uL (ref 3.4–10.8)

## 2017-05-17 LAB — PSA: PROSTATE SPECIFIC AG, SERUM: 0.2 ng/mL (ref 0.0–4.0)

## 2017-05-17 LAB — TESTOSTERONE: TESTOSTERONE: 145 ng/dL — AB (ref 264–916)

## 2017-05-23 ENCOUNTER — Telehealth: Payer: Self-pay | Admitting: Family Medicine

## 2017-05-23 NOTE — Telephone Encounter (Signed)
Copied from [place]. Topic: Quick Communication - See Telephone Encounter >> May 23, 2017  4:27 PM Bea Graff, NT wrote: CRM for notification. See Telephone encounter for: Pt needing a refill for his Testerone called into [facility] on [address].  05/23/17.

## 2017-05-24 ENCOUNTER — Other Ambulatory Visit: Payer: Self-pay | Admitting: Family Medicine

## 2017-05-24 DIAGNOSIS — E291 Testicular hypofunction: Secondary | ICD-10-CM

## 2017-05-24 MED ORDER — TESTOSTERONE CYPIONATE 200 MG/ML IM SOLN: INTRAMUSCULAR | 1 refills | Status: DC

## 2017-05-24 NOTE — Telephone Encounter (Signed)
Request for testosterone refill; last visit 05/16/17

## 2017-05-24 NOTE — Telephone Encounter (Signed)
Unsure if you saw this message. Routing to make sure you see it. Please Advise.

## 2017-05-29 ENCOUNTER — Other Ambulatory Visit: Payer: Self-pay | Admitting: Family Medicine

## 2017-05-29 ENCOUNTER — Encounter: Payer: Self-pay | Admitting: Family Medicine

## 2017-05-29 MED ORDER — SULFAMETHOXAZOLE-TRIMETHOPRIM 800-160 MG PO TABS: 1.0000 | ORAL_TABLET | Freq: Two times a day (BID) | ORAL | 0 refills | Status: DC

## 2017-05-29 NOTE — Telephone Encounter (Signed)
Copied from [place] [id number]. Topic: Quick Communication - Rx Refill/Question >> May 29, 2017  9:45 AM Yvette Rack wrote: Has the patient contacted their pharmacy? No.   (Agent: If no, request that the patient contact the pharmacy for the refill.) Septra he had some pills at home and he took three of them and it seem to work he also had sent a e-mail to [employer] (with phone number or street name): [id number] [address], [place] [phone] (Phone) [phone] (Fax)     Agent: Please be advised that RX refills may take up to 3 business days. We ask that you follow-up with your pharmacy.

## 2017-05-29 NOTE — Telephone Encounter (Signed)
Phone call Discussed with patient urinary symptoms largely gone Septra go ahead and call in a few more no STD suspected observe and if symptoms persists or become recurrent will need an office visit to check

## 2017-05-31 ENCOUNTER — Ambulatory Visit: Payer: 59 | Admitting: Family Medicine

## 2017-05-31 ENCOUNTER — Encounter: Payer: Self-pay | Admitting: Family Medicine

## 2017-05-31 ENCOUNTER — Telehealth: Payer: Self-pay | Admitting: Family Medicine

## 2017-05-31 VITALS — BP 158/91 | HR 76 | Temp 98.2°F | Wt 234.8 lb

## 2017-05-31 DIAGNOSIS — R300 Dysuria: Secondary | ICD-10-CM

## 2017-05-31 LAB — UA/M W/RFLX CULTURE, ROUTINE
BILIRUBIN UA: NEGATIVE
GLUCOSE, UA: NEGATIVE
KETONES UA: NEGATIVE
Leukocytes, UA: NEGATIVE
NITRITE UA: NEGATIVE
Protein, UA: NEGATIVE
RBC UA: NEGATIVE
UUROB: 0.2 mg/dL (ref 0.2–1.0)
pH, UA: 6 (ref 5.0–7.5)

## 2017-05-31 MED ORDER — DOXYCYCLINE HYCLATE 100 MG PO TABS: 100.0000 mg | ORAL_TABLET | Freq: Two times a day (BID) | ORAL | 0 refills | Status: DC

## 2017-05-31 NOTE — Telephone Encounter (Signed)
Called pt - U/A negative, likely because he's already taken almost a week of bactrim. Awaiting GC Chlamydia results, will start doxycycline in the meantime as he's still symptomatic. Push water, probiotics. Pt agreeable to this plan.

## 2017-05-31 NOTE — Progress Notes (Signed)
BP (!) 158/91 (BP Location: Right Arm, Patient Position: Sitting, Cuff Size: Normal)   Pulse 76   Temp 98.2 F (36.8 C) (Oral)   Wt 234 lb 12.8 oz (106.5 kg)   SpO2 100%   BMI 27.17 kg/m    Subjective:    Patient ID: Dustin [name], male    DOB: 1971-06-04, 46 y.o.   MRN: [id number]  HPI: Dustin [name] is a 46 y.o. male  Chief Complaint  Patient presents with  . Dysuria    x's 5 days. Dr. Jeananne Rama called in an antibiotic on Monday. Patient states he's slowly getting better, but not like he usually does on Batrim.    Dysuria and frequency x 5 days. Denies hematuria, fevers, chills, N/V, penile discharge or lesions. States he has not been sexually active for 2 months but wouldn't mind being screened for STDs. Took a leftover bactrim once sat and Sunday with some relief, then got another 5 day supply from Dr. Jeananne Rama for which he completed. Feeling better but dysuria still present.   Relevant past medical, surgical, family and social history reviewed and updated as indicated. Interim medical history since our last visit reviewed. Allergies and medications reviewed and updated.  Review of Systems  Constitutional: Negative.   Eyes: Negative.   Respiratory: Negative.   Cardiovascular: Negative.   Gastrointestinal: Negative.   Genitourinary: Positive for dysuria.  Musculoskeletal: Negative.   Neurological: Negative.   Psychiatric/Behavioral: Negative.    Per HPI unless specifically indicated above     Objective:    BP (!) 158/91 (BP Location: Right Arm, Patient Position: Sitting, Cuff Size: Normal)   Pulse 76   Temp 98.2 F (36.8 C) (Oral)   Wt 234 lb 12.8 oz (106.5 kg)   SpO2 100%   BMI 27.17 kg/m   Wt Readings from Last 3 Encounters:  05/31/17 234 lb 12.8 oz (106.5 kg)  05/16/17 225 lb (102.1 kg)  11/10/16 221 lb (100.2 kg)    Physical Exam  Constitutional: He is oriented to person, place, and time. He appears well-developed and well-nourished. No distress.  HENT:    Head: Atraumatic.  Eyes: Conjunctivae are normal. Pupils are equal, round, and reactive to light.  Neck: Normal range of motion. Neck supple.  Cardiovascular: Normal rate and normal heart sounds.  Pulmonary/Chest: Effort normal and breath sounds normal.  Musculoskeletal: Normal range of motion. He exhibits no tenderness (no CVA tenderness b/l).  Neurological: He is alert and oriented to person, place, and time.  Skin: Skin is warm and dry.  Psychiatric: He has a normal mood and affect. His behavior is normal.  Nursing note and vitals reviewed.  Results for orders placed or performed in visit on 05/31/17  GC/Chlamydia Probe Amp  Result Value Ref Range   Chlamydia trachomatis, NAA Negative Negative   Neisseria gonorrhoeae by PCR Negative Negative  UA/M w/rflx Culture, Routine  Result Value Ref Range   Specific Gravity, UA <1.005 (L) 1.005 - 1.030   pH, UA 6.0 5.0 - 7.5   Color, UA Yellow Yellow   Appearance Ur Clear Clear   Leukocytes, UA Negative Negative   Protein, UA Negative Negative/Trace   Glucose, UA Negative Negative   Ketones, UA Negative Negative   RBC, UA Negative Negative   Bilirubin, UA Negative Negative   Urobilinogen, Ur 0.2 0.2 - 1.0 mg/dL   Nitrite, UA Negative Negative      Assessment & Plan:   Problem List Items Addressed This Visit  None    Visit Diagnoses    Dysuria    -  Primary   U/A neg, likely due to recent abx prior to testing. Will r/o GC Chlamydia, in meantime will send in course of doxy in case lingering bacteria or STI. Push fluid   Relevant Orders   UA/M w/rflx Culture, Routine (Completed)   GC/Chlamydia Probe Amp (Completed)       Follow up plan: Return if symptoms worsen or fail to improve.

## 2017-06-02 LAB — GC/CHLAMYDIA PROBE AMP
Chlamydia trachomatis, NAA: NEGATIVE
NEISSERIA GONORRHOEAE BY PCR: NEGATIVE

## 2017-06-02 NOTE — Patient Instructions (Signed)
Follow up as needed

## 2017-06-05 ENCOUNTER — Other Ambulatory Visit: Payer: Self-pay

## 2017-11-14 ENCOUNTER — Ambulatory Visit (INDEPENDENT_AMBULATORY_CARE_PROVIDER_SITE_OTHER): Payer: 59 | Admitting: Family Medicine

## 2017-11-14 ENCOUNTER — Encounter: Payer: Self-pay | Admitting: Family Medicine

## 2017-11-14 VITALS — BP 136/80 | HR 62 | Ht 74.0 in | Wt 227.0 lb

## 2017-11-14 DIAGNOSIS — Z0000 Encounter for general adult medical examination without abnormal findings: Secondary | ICD-10-CM | POA: Diagnosis not present

## 2017-11-14 DIAGNOSIS — I10 Essential (primary) hypertension: Secondary | ICD-10-CM | POA: Diagnosis not present

## 2017-11-14 DIAGNOSIS — E291 Testicular hypofunction: Secondary | ICD-10-CM

## 2017-11-14 LAB — URINALYSIS, ROUTINE W REFLEX MICROSCOPIC
Bilirubin, UA: NEGATIVE
Glucose, UA: NEGATIVE
KETONES UA: NEGATIVE
Leukocytes, UA: NEGATIVE
NITRITE UA: NEGATIVE
PH UA: 6 (ref 5.0–7.5)
Protein, UA: NEGATIVE
RBC UA: NEGATIVE
Specific Gravity, UA: 1.01 (ref 1.005–1.030)
UUROB: 0.2 mg/dL (ref 0.2–1.0)

## 2017-11-14 MED ORDER — FLUTICASONE PROPIONATE 50 MCG/ACT NA SUSP: 2.0000 | Freq: Every day | NASAL | 12 refills | Status: DC

## 2017-11-14 MED ORDER — AMLODIPINE BESYLATE 5 MG PO TABS: 5.0000 mg | ORAL_TABLET | Freq: Every day | ORAL | 4 refills | Status: DC

## 2017-11-14 MED ORDER — TESTOSTERONE CYPIONATE 200 MG/ML IM SOLN: INTRAMUSCULAR | 1 refills | Status: DC

## 2017-11-14 NOTE — Assessment & Plan Note (Signed)
The current medical regimen is effective;  continue present plan and medications.  

## 2017-11-14 NOTE — Progress Notes (Signed)
BP 136/80 (BP Location: Left Arm)   Pulse 62   Ht 6\' 2"  (1.88 m)   Wt 227 lb (103 kg)   SpO2 98%   BMI 29.15 kg/m    Subjective:    Patient ID: Dustin [name], male    DOB: June 23, 1970, 47 y.o.   MRN: [id number]  HPI: Dustin [name] is a 47 y.o. male  Chief Complaint  Patient presents with  . Annual Exam   Patient all in all doing well taking amlodipine 5 mg without problems good blood pressure control. Taking testosterone injection doing self injections without problems injected 3 days ago. Taking Flonase also without problems and good control of allergies. Relevant past medical, surgical, family and social history reviewed and updated as indicated. Interim medical history since our last visit reviewed. Allergies and medications reviewed and updated.  Review of Systems  Constitutional: Negative.   HENT: Negative.   Eyes: Negative.   Respiratory: Negative.   Cardiovascular: Negative.   Gastrointestinal: Negative.   Endocrine: Negative.   Genitourinary: Negative.   Musculoskeletal: Negative.   Skin: Negative.   Allergic/Immunologic: Negative.   Neurological: Negative.   Hematological: Negative.   Psychiatric/Behavioral: Negative.     Per HPI unless specifically indicated above     Objective:    BP 136/80 (BP Location: Left Arm)   Pulse 62   Ht 6\' 2"  (1.88 m)   Wt 227 lb (103 kg)   SpO2 98%   BMI 29.15 kg/m   Wt Readings from Last 3 Encounters:  11/14/17 227 lb (103 kg)  05/31/17 234 lb 12.8 oz (106.5 kg)  05/16/17 225 lb (102.1 kg)    Physical Exam  Constitutional: He is oriented to person, place, and time. He appears well-developed and well-nourished.  HENT:  Head: Normocephalic and atraumatic.  Right Ear: External ear normal.  Left Ear: External ear normal.  Eyes: Pupils are equal, round, and reactive to light. Conjunctivae and EOM are normal.  Neck: Normal range of motion. Neck supple.  Cardiovascular: Normal rate, regular rhythm, normal heart sounds  and intact distal pulses.  Pulmonary/Chest: Effort normal and breath sounds normal.  Abdominal: Soft. Bowel sounds are normal. There is no splenomegaly or hepatomegaly.  Genitourinary: Rectum normal, prostate normal and penis normal.  Musculoskeletal: Normal range of motion.  Neurological: He is alert and oriented to person, place, and time. He has normal reflexes.  Skin: No rash noted. No erythema.  Psychiatric: He has a normal mood and affect. His behavior is normal. Judgment and thought content normal.    Results for orders placed or performed in visit on 05/31/17  GC/Chlamydia Probe Amp  Result Value Ref Range   Chlamydia trachomatis, NAA Negative Negative   Neisseria gonorrhoeae by PCR Negative Negative  UA/M w/rflx Culture, Routine  Result Value Ref Range   Specific Gravity, UA <1.005 (L) 1.005 - 1.030   pH, UA 6.0 5.0 - 7.5   Color, UA Yellow Yellow   Appearance Ur Clear Clear   Leukocytes, UA Negative Negative   Protein, UA Negative Negative/Trace   Glucose, UA Negative Negative   Ketones, UA Negative Negative   RBC, UA Negative Negative   Bilirubin, UA Negative Negative   Urobilinogen, Ur 0.2 0.2 - 1.0 mg/dL   Nitrite, UA Negative Negative      Assessment & Plan:   Problem List Items Addressed This Visit      Cardiovascular and Mediastinum   Hypertension    The current medical regimen is  effective;  continue present plan and medications.       Relevant Medications   amLODipine (NORVASC) 5 MG tablet   Other Relevant Orders   Comprehensive metabolic panel   Lipid panel   CBC with Differential/Platelet   TSH   Urinalysis, Routine w reflex microscopic     Endocrine   Male hypogonadism    The current medical regimen is effective;  continue present plan and medications.       Relevant Medications   testosterone cypionate (DEPOTESTOSTERONE CYPIONATE) 200 MG/ML injection   Other Relevant Orders   PSA   Testosterone    Other Visit Diagnoses    PE  (physical exam), annual    -  Primary   Relevant Orders   Comprehensive metabolic panel   Lipid panel   CBC with Differential/Platelet   TSH   Urinalysis, Routine w reflex microscopic   PSA       Follow up plan: Return in about 6 months (around 05/16/2018) for BMP. CBC, testesterone and PSA.

## 2017-11-14 NOTE — Addendum Note (Signed)
Addended by: Crissie Figures R on: 11/14/2017 01:37 PM   Modules accepted: Orders

## 2017-11-15 LAB — CBC WITH DIFFERENTIAL/PLATELET
Basophils Absolute: 0 10*3/uL (ref 0.0–0.2)
Basos: 0 %
EOS (ABSOLUTE): 0.2 10*3/uL (ref 0.0–0.4)
EOS: 3 %
HEMATOCRIT: 44.2 % (ref 37.5–51.0)
Hemoglobin: 13.3 g/dL (ref 13.0–17.7)
Immature Grans (Abs): 0 10*3/uL (ref 0.0–0.1)
Immature Granulocytes: 0 %
LYMPHS ABS: 0.9 10*3/uL (ref 0.7–3.1)
Lymphs: 16 %
MCH: 25.7 pg — ABNORMAL LOW (ref 26.6–33.0)
MCHC: 30.1 g/dL — ABNORMAL LOW (ref 31.5–35.7)
MCV: 86 fL (ref 79–97)
MONOS ABS: 0.4 10*3/uL (ref 0.1–0.9)
Monocytes: 7 %
Neutrophils Absolute: 4.4 10*3/uL (ref 1.4–7.0)
Neutrophils: 74 %
Platelets: 307 10*3/uL (ref 150–450)
RBC: 5.17 x10E6/uL (ref 4.14–5.80)
RDW: 16.8 % — AB (ref 12.3–15.4)
WBC: 6 10*3/uL (ref 3.4–10.8)

## 2017-11-15 LAB — COMPREHENSIVE METABOLIC PANEL
ALT: 33 IU/L (ref 0–44)
AST: 85 IU/L — ABNORMAL HIGH (ref 0–40)
Albumin/Globulin Ratio: 1.3 (ref 1.2–2.2)
Albumin: 4.1 g/dL (ref 3.5–5.5)
Alkaline Phosphatase: 34 IU/L — ABNORMAL LOW (ref 39–117)
BUN/Creatinine Ratio: 17 (ref 9–20)
BUN: 21 mg/dL (ref 6–24)
Bilirubin Total: 0.4 mg/dL (ref 0.0–1.2)
CALCIUM: 9.2 mg/dL (ref 8.7–10.2)
CHLORIDE: 100 mmol/L (ref 96–106)
CO2: 24 mmol/L (ref 20–29)
CREATININE: 1.24 mg/dL (ref 0.76–1.27)
GFR, EST AFRICAN AMERICAN: 81 mL/min/{1.73_m2} (ref >59)
GFR, EST NON AFRICAN AMERICAN: 70 mL/min/{1.73_m2} (ref >59)
GLUCOSE: 82 mg/dL (ref 65–99)
Globulin, Total: 3.1 g/dL (ref 1.5–4.5)
Potassium: 4 mmol/L (ref 3.5–5.2)
Sodium: 139 mmol/L (ref 134–144)
TOTAL PROTEIN: 7.2 g/dL (ref 6.0–8.5)

## 2017-11-15 LAB — TESTOSTERONE: TESTOSTERONE: 814 ng/dL (ref 264–916)

## 2017-11-15 LAB — LIPID PANEL
CHOL/HDL RATIO: 4.9 ratio (ref 0.0–5.0)
Cholesterol, Total: 161 mg/dL (ref 100–199)
HDL: 33 mg/dL — AB (ref >39)
LDL CALC: 118 mg/dL — AB (ref 0–99)
Triglycerides: 48 mg/dL (ref 0–149)
VLDL CHOLESTEROL CAL: 10 mg/dL (ref 5–40)

## 2017-11-15 LAB — PSA: PROSTATE SPECIFIC AG, SERUM: 0.6 ng/mL (ref 0.0–4.0)

## 2017-11-15 LAB — TSH: TSH: 2.01 u[IU]/mL (ref 0.450–4.500)

## 2017-11-16 ENCOUNTER — Telehealth: Payer: Self-pay | Admitting: Family Medicine

## 2017-11-16 NOTE — Telephone Encounter (Signed)
-----   Message from Amada Kingfisher, [place] sent at 11/16/2017 12:19 PM EDT ----- Patient was transferred to provider for telephone conversation.

## 2017-11-16 NOTE — Telephone Encounter (Signed)
Phone call Discussed with patient slight elevation of liver enzymes will recheck liver enzymes next office visit.

## 2017-12-13 ENCOUNTER — Encounter: Payer: Self-pay | Admitting: Family Medicine

## 2018-05-04 ENCOUNTER — Ambulatory Visit: Payer: Self-pay | Admitting: *Deleted

## 2018-05-04 ENCOUNTER — Other Ambulatory Visit: Payer: Self-pay

## 2018-05-04 ENCOUNTER — Encounter: Payer: Self-pay | Admitting: Emergency Medicine

## 2018-05-04 ENCOUNTER — Emergency Department: Payer: Managed Care, Other (non HMO)

## 2018-05-04 ENCOUNTER — Emergency Department
Admission: EM | Admit: 2018-05-04 | Discharge: 2018-05-04 | Disposition: A | Discharge status: Home / Self Care | Payer: Managed Care, Other (non HMO) | Attending: Emergency Medicine | Admitting: Emergency Medicine

## 2018-05-04 DIAGNOSIS — Z79899 Other long term (current) drug therapy: Secondary | ICD-10-CM | POA: Diagnosis not present

## 2018-05-04 DIAGNOSIS — I10 Essential (primary) hypertension: Secondary | ICD-10-CM | POA: Insufficient documentation

## 2018-05-04 DIAGNOSIS — R002 Palpitations: Secondary | ICD-10-CM | POA: Diagnosis not present

## 2018-05-04 DIAGNOSIS — I459 Conduction disorder, unspecified: Secondary | ICD-10-CM

## 2018-05-04 LAB — CBC
HCT: 44.4 % (ref 39.0–52.0)
Hemoglobin: 14.3 g/dL (ref 13.0–17.0)
MCH: 28.2 pg (ref 26.0–34.0)
MCHC: 32.2 g/dL (ref 30.0–36.0)
MCV: 87.6 fL (ref 80.0–100.0)
PLATELETS: 244 10*3/uL (ref 150–400)
RBC: 5.07 MIL/uL (ref 4.22–5.81)
RDW: 14.3 % (ref 11.5–15.5)
WBC: 7.5 10*3/uL (ref 4.0–10.5)
nRBC: 0 % (ref 0.0–0.2)

## 2018-05-04 LAB — BASIC METABOLIC PANEL
ANION GAP: 6 (ref 5–15)
BUN: 18 mg/dL (ref 6–20)
CALCIUM: 8.9 mg/dL (ref 8.9–10.3)
CO2: 27 mmol/L (ref 22–32)
Chloride: 102 mmol/L (ref 98–111)
Creatinine, Ser: 1.23 mg/dL (ref 0.61–1.24)
GFR calc Af Amer: >60 mL/min (ref >60)
GLUCOSE: 104 mg/dL — AB (ref 70–99)
Potassium: 3.9 mmol/L (ref 3.5–5.1)
SODIUM: 135 mmol/L (ref 135–145)

## 2018-05-04 LAB — TROPONIN I

## 2018-05-04 NOTE — ED Triage Notes (Signed)
Pt in via POV, reports feeling "weird, like I had had a bunch of energy drinks."  Pt reports checking his pulse and it being irregular.  Pt denies any chest pain or palpitations, reports hx of early onset cardiac disease in family.  Vitals WDL, NAD noted at this time.

## 2018-05-04 NOTE — Telephone Encounter (Signed)
Agree with ED  

## 2018-05-04 NOTE — Discharge Instructions (Addendum)
As we discussed please talk to your primary care doctor about your EKG, and if possible try to obtain the EKG you had in the past to compare them. Please seek medical attention for any high fevers, chest pain, shortness of breath, change in behavior, persistent vomiting, bloody stool or any other new or concerning symptoms.

## 2018-05-04 NOTE — ED Notes (Signed)
ED Provider at bedside. 

## 2018-05-04 NOTE — ED Provider Notes (Signed)
[facility] Emergency Department Provider Note   ____________________________________________   I have reviewed the triage vital signs and the nursing notes.   HISTORY  Chief Complaint Skipped beats  History limited by: Not Limited   HPI Dustin [name] is a 47 y.o. male who presents to the emergency department today with concerns for the sensation of skipping heartbeats.  The patient states that he is notices for the past couple of days.  He will count his pulse and it seems like he might skip a beat every 10 beats.  He states that it causes him to feel somewhat off.  Patient denies any pain with this.  He states he might of had similar symptoms in the past but never this frequently or this long lasting.  He denies any recent changes in medications.  He denies any recent alcohol use or sleep deprivation. Does have family history of coronary artery disease.  Per medical record review patient has a history of GERD, anemia, HTN.   Past Medical History:  Diagnosis Date  . Anemia   . GERD (gastroesophageal reflux disease)   . Hypertension   . Male hypogonadism     Patient Active Problem List   Diagnosis Date Noted  . Plantar fasciitis of right foot 04/26/2016  . Hypertension   . GERD (gastroesophageal reflux disease)   . Anemia   . Male hypogonadism     Past Surgical History:  Procedure Laterality Date  . TONSILLECTOMY    . TYMPANOSTOMY TUBE PLACEMENT     t    Prior to Admission medications   Medication Sig Start Date End Date Taking? Authorizing Provider  amLODipine (NORVASC) 5 MG tablet Take 1 tablet (5 mg total) by mouth daily. 11/14/17   Guadalupe Maple, MD  B-D 3CC LUER-LOK SYR 18GX1-1/2 18G X 1-1/2" 3 ML MISC USE AS DIRECTED WITH TESTOSTERONE 11/17/14   Johnson, Megan P, DO  fluticasone (FLONASE) 50 MCG/ACT nasal spray Place 2 sprays into both nostrils daily. 11/14/17   Guadalupe Maple, MD  testosterone cypionate (DEPOTESTOSTERONE CYPIONATE) 200  MG/ML injection INJECT 1ML EVERY 2 WEEKS 11/14/17   Guadalupe Maple, MD    Allergies Penicillins  Family History  Problem Relation Age of Onset  . Thyroid disease Mother   . Heart disease Father   . Heart disease Paternal Grandfather     Social History Social History   Tobacco Use  . Smoking status: Never Smoker  . Smokeless tobacco: Never Used  Substance Use Topics  . Alcohol use: Yes  . Drug use: No    Review of Systems Constitutional: No fever/chills Eyes: No visual changes. ENT: No sore throat. Cardiovascular: Positive for sensation of skipped beats.  Respiratory: Denies shortness of breath. Gastrointestinal: No abdominal pain.  No nausea, no vomiting.  No diarrhea.   Genitourinary: Negative for dysuria. Musculoskeletal: Negative for back pain. Skin: Negative for rash. Neurological: Negative for headaches, focal weakness or numbness.  ____________________________________________   PHYSICAL EXAM:  VITAL SIGNS: ED Triage Vitals  Enc Vitals Group     BP 05/04/18 1511 (!) 144/73     Pulse Rate 05/04/18 1511 72     Resp --      Temp 05/04/18 1511 97.9 F (36.6 C)     Temp Source 05/04/18 1511 Oral     SpO2 05/04/18 1511 99 %     Weight 05/04/18 1512 225 lb (102.1 kg)     Height 05/04/18 1512 6\' 5"  (1.956 m)  Head Circumference --      Peak Flow --      Pain Score 05/04/18 1517 0   Constitutional: Alert and oriented.  Eyes: Conjunctivae are normal.  ENT      Head: Normocephalic and atraumatic.      Nose: No congestion/rhinnorhea.      Mouth/Throat: Mucous membranes are moist.      Neck: No stridor. Hematological/Lymphatic/Immunilogical: No cervical lymphadenopathy. Cardiovascular: Normal rate, regular rhythm.  No murmurs, rubs, or gallops.  Respiratory: Normal respiratory effort without tachypnea nor retractions. Breath sounds are clear and equal bilaterally. No wheezes/rales/rhonchi. Gastrointestinal: Soft and non tender. No rebound. No guarding.   Genitourinary: Deferred Musculoskeletal: Normal range of motion in all extremities. No lower extremity edema. Neurologic:  Normal speech and language. No gross focal neurologic deficits are appreciated.  Skin:  Skin is warm, dry and intact. No rash noted. Psychiatric: Mood and affect are normal. Speech and behavior are normal. Patient exhibits appropriate insight and judgment.  ____________________________________________    LABS (pertinent positives/negatives)  Trop <0.03 CBC wbc 7.5, hgb 14.3, plt 244 BMP wnl except glu 104  ____________________________________________   EKG  I, Nance Pear, attending physician, personally viewed and interpreted this EKG  EKG Time: 1509 Rate: 68 Rhythm: sinus rhythm Axis: right axis deviation Intervals: qtc 416 QRS: narrow, LVH ST changes: no st elevation Impression: abnormal ekg  ____________________________________________    RADIOLOGY  CXR No acute findings  ____________________________________________   PROCEDURES  Procedures  ____________________________________________   INITIAL IMPRESSION / ASSESSMENT AND PLAN / ED COURSE  Pertinent labs & imaging results that were available during my care of the patient were reviewed by me and considered in my medical decision making (see chart for details).   Patient presents to the emergency department today because of concerns for the sensation of skipped beats.  Patient's EKG is however no signs of any acute coronary injury.  Patient's blood work without concerning electrolyte abnormalities or volume troponin.  Patient's heart size was normal chest x-ray.  This point given the patient's description do wonder if he is having PVCs.  I discussed this with the patient.  Discussed follow-up with primary care.  ____________________________________________   FINAL CLINICAL IMPRESSION(S) / ED DIAGNOSES  Final diagnoses:  Skipped heart beats     Note: This dictation was  prepared with Dragon dictation. Any transcriptional errors that result from this process are unintentional     Nance Pear, MD 05/04/18 1643

## 2018-05-04 NOTE — Telephone Encounter (Signed)
Patient is calling to report he started having irregular heart beat last night- he states he could feel it and his BP cuff would not even register a BP. Patient states it eventually calmed and got better- today he has felt weird and he states he thinks it is starting again. Patient advised ED- office notified  Reason for Disposition . Patient sounds very sick or weak to the triager    Patient states he is having irregular heart rate- he states he is skipping beats and he feels bad. Patient states it started last night and it got better- but he is feeling it again today. Advised ED.  Answer Assessment - Initial Assessment Questions 1. DESCRIPTION: "Please describe your heart rate or heart beat that you are having" (e.g., fast/slow, regular/irregular, skipped or extra beats, "palpitations")     Skipping beats 2. ONSET: "When did it start?" (Minutes, hours or days)      Started feeling this way yesterday 3. DURATION: "How long does it last" (e.g., seconds, minutes, hours)     hours 4. PATTERN "Does it come and go, or has it been constant since it started?"  "Does it get worse with exertion?"   "Are you feeling it now?"     Comes and goes- patient is feeling it today- states he feels "weird' 5. TAP: "Using your hand, can you tap out what you are feeling on a chair or table in front of you, so that I can hear?" (Note: not all patients can do this)       10 beats and miss one last night- then spread out and got less and less 6. HEART RATE: "Can you tell me your heart rate?" "How many beats in 15 seconds?"  (Note: not all patients can do this)       72- patient states it was regular when counted- but he is sure if he would have kept feeling it- he would have felt a skip. 7. RECURRENT SYMPTOM: "Have you ever had this before?" If so, ask: "When was the last time?" and "What happened that time?"      Not that recognized  8. CAUSE: "What do you think is causing the palpitations?"     Irregular heart  rate 9. CARDIAC HISTORY: "Do you have any history of heart disease?" (e.g., heart attack, angina, bypass surgery, angioplasty, arrhythmia)      Family history 10. OTHER SYMPTOMS: "Do you have any other symptoms?" (e.g., dizziness, chest pain, sweating, difficulty breathing)       Little dizzy at times 11. PREGNANCY: "Is there any chance you are pregnant?" "When was your last menstrual period?"       n/a  Protocols used: HEART RATE AND HEARTBEAT QUESTIONS-A-AH

## 2018-05-04 NOTE — Telephone Encounter (Signed)
Please see message for FYI.  thanks

## 2018-05-04 NOTE — ED Notes (Signed)
Registration in room 

## 2018-05-22 ENCOUNTER — Ambulatory Visit (INDEPENDENT_AMBULATORY_CARE_PROVIDER_SITE_OTHER): Payer: 59 | Admitting: Family Medicine

## 2018-05-22 ENCOUNTER — Encounter: Payer: Self-pay | Admitting: Family Medicine

## 2018-05-22 ENCOUNTER — Ambulatory Visit: Payer: 59 | Admitting: Family Medicine

## 2018-05-22 DIAGNOSIS — I10 Essential (primary) hypertension: Secondary | ICD-10-CM | POA: Diagnosis not present

## 2018-05-22 DIAGNOSIS — E291 Testicular hypofunction: Secondary | ICD-10-CM

## 2018-05-22 MED ORDER — TESTOSTERONE CYPIONATE 200 MG/ML IM SOLN: INTRAMUSCULAR | 1 refills | Status: DC

## 2018-05-22 NOTE — Progress Notes (Signed)
BP 133/81   Pulse (!) 56   Temp 98.3 F (36.8 C)   Wt 232 lb (105.2 kg)   SpO2 98%   BMI 27.51 kg/m    Subjective:    Patient ID: Dustin [name], male    DOB: 10/13/1970, 47 y.o.   MRN: [id number]  HPI: Dustin [name] is a 47 y.o. male  Chief Complaint  Patient presents with  . Hypertension   Patient also hypogonadal takes testosterone injections was taking every 2 weeks but was having big swings is now doing every week with half a dose and doing better.  Took his injection 2 days early today so that we could get a peak level. Blood pressures been doing well with no complaints. Patient was in the hospital for chest pain which was negative with some sinus arrhythmia which is felt to be secondary to stress and are caffeine and/or sleep deprivation.  Relevant past medical, surgical, family and social history reviewed and updated as indicated. Interim medical history since our last visit reviewed. Allergies and medications reviewed and updated.  Review of Systems  Constitutional: Negative.   Respiratory: Negative.   Cardiovascular: Negative.     Per HPI unless specifically indicated above     Objective:    BP 133/81   Pulse (!) 56   Temp 98.3 F (36.8 C)   Wt 232 lb (105.2 kg)   SpO2 98%   BMI 27.51 kg/m   Wt Readings from Last 3 Encounters:  05/22/18 232 lb (105.2 kg)  05/04/18 225 lb (102.1 kg)  11/14/17 227 lb (103 kg)    Physical Exam  Constitutional: He is oriented to person, place, and time. He appears well-developed and well-nourished.  HENT:  Head: Normocephalic and atraumatic.  Eyes: Conjunctivae and EOM are normal.  Neck: Normal range of motion.  Cardiovascular: Normal rate, regular rhythm and normal heart sounds.  Pulmonary/Chest: Effort normal and breath sounds normal.  Musculoskeletal: Normal range of motion.  Neurological: He is alert and oriented to person, place, and time.  Skin: No erythema.  Psychiatric: He has a normal mood and affect. His  behavior is normal. Judgment and thought content normal.    Results for orders placed or performed during the hospital encounter of 76/72/09  Basic metabolic panel  Result Value Ref Range   Sodium 135 135 - 145 mmol/L   Potassium 3.9 3.5 - 5.1 mmol/L   Chloride 102 98 - 111 mmol/L   CO2 27 22 - 32 mmol/L   Glucose, Bld 104 (H) 70 - 99 mg/dL   BUN 18 6 - 20 mg/dL   Creatinine, Ser 1.23 0.61 - 1.24 mg/dL   Calcium 8.9 8.9 - 10.3 mg/dL   GFR calc non Af Amer >60 >60 mL/min   GFR calc Af Amer >60 >60 mL/min   Anion gap 6 5 - 15  CBC  Result Value Ref Range   WBC 7.5 4.0 - 10.5 K/uL   RBC 5.07 4.22 - 5.81 MIL/uL   Hemoglobin 14.3 13.0 - 17.0 g/dL   HCT 44.4 39.0 - 52.0 %   MCV 87.6 80.0 - 100.0 fL   MCH 28.2 26.0 - 34.0 pg   MCHC 32.2 30.0 - 36.0 g/dL   RDW 14.3 11.5 - 15.5 %   Platelets 244 150 - 400 K/uL   nRBC 0.0 0.0 - 0.2 %  Troponin I - ONCE - STAT  Result Value Ref Range   Troponin I <0.03 <0.03 ng/mL  Assessment & Plan:   Problem List Items Addressed This Visit      Cardiovascular and Mediastinum   Hypertension    The current medical regimen is effective;  continue present plan and medications.       Relevant Orders   CBC with Differential/Platelet   Comprehensive metabolic panel     Endocrine   Male hypogonadism    The current medical regimen is effective;  continue present plan and medications.       Relevant Medications   testosterone cypionate (DEPOTESTOSTERONE CYPIONATE) 200 MG/ML injection   Other Relevant Orders   CBC with Differential/Platelet   PSA   Testosterone   Comprehensive metabolic panel       Follow up plan: Return in about 6 months (around 11/21/2018) for Physical Exam, testosterone.

## 2018-05-22 NOTE — Assessment & Plan Note (Signed)
The current medical regimen is effective;  continue present plan and medications.  

## 2018-05-23 ENCOUNTER — Encounter: Payer: Self-pay | Admitting: Family Medicine

## 2018-05-23 LAB — CBC WITH DIFFERENTIAL/PLATELET
Basophils Absolute: 0 10*3/uL (ref 0.0–0.2)
Basos: 0 %
EOS (ABSOLUTE): 0.3 10*3/uL (ref 0.0–0.4)
Eos: 6 %
HEMOGLOBIN: 14.3 g/dL (ref 13.0–17.7)
Hematocrit: 43.6 % (ref 37.5–51.0)
IMMATURE GRANS (ABS): 0 10*3/uL (ref 0.0–0.1)
IMMATURE GRANULOCYTES: 0 %
Lymphocytes Absolute: 1 10*3/uL (ref 0.7–3.1)
Lymphs: 22 %
MCH: 27.6 pg (ref 26.6–33.0)
MCHC: 32.8 g/dL (ref 31.5–35.7)
MCV: 84 fL (ref 79–97)
MONOS ABS: 0.5 10*3/uL (ref 0.1–0.9)
Monocytes: 10 %
Neutrophils Absolute: 2.8 10*3/uL (ref 1.4–7.0)
Neutrophils: 62 %
Platelets: 249 10*3/uL (ref 150–450)
RBC: 5.18 x10E6/uL (ref 4.14–5.80)
RDW: 14.4 % (ref 12.3–15.4)
WBC: 4.6 10*3/uL (ref 3.4–10.8)

## 2018-05-23 LAB — COMPREHENSIVE METABOLIC PANEL
A/G RATIO: 1.7 (ref 1.2–2.2)
ALBUMIN: 4.4 g/dL (ref 3.5–5.5)
ALT: 25 IU/L (ref 0–44)
AST: 64 IU/L — ABNORMAL HIGH (ref 0–40)
Alkaline Phosphatase: 34 IU/L — ABNORMAL LOW (ref 39–117)
BUN / CREAT RATIO: 14 (ref 9–20)
BUN: 17 mg/dL (ref 6–24)
Bilirubin Total: 0.4 mg/dL (ref 0.0–1.2)
CALCIUM: 9.6 mg/dL (ref 8.7–10.2)
CHLORIDE: 99 mmol/L (ref 96–106)
CO2: 24 mmol/L (ref 20–29)
CREATININE: 1.19 mg/dL (ref 0.76–1.27)
GFR calc Af Amer: 85 mL/min/{1.73_m2} (ref >59)
GFR calc non Af Amer: 73 mL/min/{1.73_m2} (ref >59)
Globulin, Total: 2.6 g/dL (ref 1.5–4.5)
Glucose: 88 mg/dL (ref 65–99)
Potassium: 4 mmol/L (ref 3.5–5.2)
Sodium: 138 mmol/L (ref 134–144)
TOTAL PROTEIN: 7 g/dL (ref 6.0–8.5)

## 2018-05-23 LAB — TESTOSTERONE: Testosterone: 974 ng/dL — ABNORMAL HIGH (ref 264–916)

## 2018-05-23 LAB — PSA: Prostate Specific Ag, Serum: 0.4 ng/mL (ref 0.0–4.0)

## 2018-07-24 ENCOUNTER — Ambulatory Visit (INDEPENDENT_AMBULATORY_CARE_PROVIDER_SITE_OTHER): Payer: Managed Care, Other (non HMO) | Admitting: Podiatry

## 2018-07-24 ENCOUNTER — Encounter: Payer: Self-pay | Admitting: Podiatry

## 2018-07-24 VITALS — BP 146/86 | HR 64 | Resp 16

## 2018-07-24 DIAGNOSIS — L603 Nail dystrophy: Secondary | ICD-10-CM | POA: Diagnosis not present

## 2018-07-24 MED ORDER — TERBINAFINE HCL 250 MG PO TABS: 250.0000 mg | ORAL_TABLET | Freq: Every day | ORAL | 0 refills | Status: DC

## 2018-07-25 NOTE — Progress Notes (Signed)
  Subjective:  Patient ID: Dustin [name], male    DOB: 12-02-1970,  MRN: [id number] HPI Chief Complaint  Patient presents with  . Nail Problem    Toenails left - thick and discolored nails x years, tried lamisil x 3 months, interested in laser  . New Patient (Initial Visit)    48 y.o. male presents with the above complaint.   ROS: He denies fever chills nausea vomiting muscle aches pains calf pain back pain chest pain shortness of breath.  Past Medical History:  Diagnosis Date  . Anemia   . GERD (gastroesophageal reflux disease)   . Hypertension   . Male hypogonadism    Past Surgical History:  Procedure Laterality Date  . TONSILLECTOMY    . TYMPANOSTOMY TUBE PLACEMENT     t    Current Outpatient Medications:  .  amLODipine (NORVASC) 5 MG tablet, Take 1 tablet (5 mg total) by mouth daily., Disp: 90 tablet, Rfl: 4 .  B-D 3CC LUER-LOK SYR 18GX1-1/2 18G X 1-1/2" 3 ML MISC, USE AS DIRECTED WITH TESTOSTERONE, Disp: 12 each, Rfl: 0 .  fluticasone (FLONASE) 50 MCG/ACT nasal spray, Place 2 sprays into both nostrils daily., Disp: 16 g, Rfl: 12 .  terbinafine (LAMISIL) 250 MG tablet, Take 1 tablet (250 mg total) by mouth daily., Disp: 30 tablet, Rfl: 0 .  testosterone cypionate (DEPOTESTOSTERONE CYPIONATE) 200 MG/ML injection, INJECT 1ML EVERY 2 WEEKS, Disp: 6 mL, Rfl: 1  Allergies  Allergen Reactions  . Penicillins Rash   Review of Systems Objective:   Vitals:   07/24/18 1555  BP: (!) 146/86  Pulse: 64  Resp: 16    General: Well developed, nourished, in no acute distress, alert and oriented x3   Dermatological: Skin is warm, dry and supple bilateral. Nails x 10 are well maintained; remaining integument appears unremarkable at this time. There are no open sores, no preulcerative lesions, no rash or signs of infection present.  Severe onychomycosis all of it is to the left foot.  Tinea pedis bilateral.  Nail plates of the right foot are clear.  Vascular: Dorsalis Pedis artery  and Posterior Tibial artery pedal pulses are 2/4 bilateral with immedate capillary fill time. Pedal hair growth present. No varicosities and no lower extremity edema present bilateral.   Neruologic: Grossly intact via light touch bilateral. Vibratory intact via tuning fork bilateral. Protective threshold with Semmes Wienstein monofilament intact to all pedal sites bilateral. Patellar and Achilles deep tendon reflexes 2+ bilateral. No Babinski or clonus noted bilateral.   Musculoskeletal: No gross boney pedal deformities bilateral. No pain, crepitus, or limitation noted with foot and ankle range of motion bilateral. Muscular strength 5/5 in all groups tested bilateral.  Gait: Unassisted, Nonantalgic.    Radiographs:  None taken  Assessment & Plan:   Assessment: Onychomycosis left foot tinea pedis bilateral  Plan: We will start with laser therapy monthly and we will also do Lamisil therapy 1 tablet 7 days the first of each month dispense 30 tablets at this point.     Max T. [place], [place]

## 2018-07-31 ENCOUNTER — Ambulatory Visit (INDEPENDENT_AMBULATORY_CARE_PROVIDER_SITE_OTHER): Payer: Managed Care, Other (non HMO)

## 2018-07-31 DIAGNOSIS — L603 Nail dystrophy: Secondary | ICD-10-CM

## 2018-08-01 NOTE — Progress Notes (Signed)
Pt presents with mycotic infection of nails 1-5 bilateral.  All other systems are negative  Laser therapy administered to affected nails and tolerated well. All safety precautions were in place.  2nd treatment.  Follow up in 4 weeks     

## 2018-08-29 ENCOUNTER — Other Ambulatory Visit: Payer: Self-pay

## 2018-08-29 DIAGNOSIS — B351 Tinea unguium: Secondary | ICD-10-CM

## 2018-09-26 ENCOUNTER — Other Ambulatory Visit: Payer: Managed Care, Other (non HMO)

## 2018-10-30 ENCOUNTER — Telehealth: Payer: Self-pay | Admitting: *Deleted

## 2018-10-30 MED ORDER — TERBINAFINE HCL 250 MG PO TABS: 250.0000 mg | ORAL_TABLET | Freq: Every day | ORAL | 0 refills | Status: DC

## 2018-10-30 NOTE — Telephone Encounter (Signed)
I informed pt the lamisil had been sent to the [facility].

## 2018-10-30 NOTE — Telephone Encounter (Signed)
Just rx what he had before.  Look at last note

## 2018-11-22 ENCOUNTER — Encounter: Payer: 59 | Admitting: Family Medicine

## 2018-12-26 ENCOUNTER — Other Ambulatory Visit: Payer: Self-pay | Admitting: Family Medicine

## 2018-12-26 ENCOUNTER — Ambulatory Visit (INDEPENDENT_AMBULATORY_CARE_PROVIDER_SITE_OTHER): Payer: 59 | Admitting: Family Medicine

## 2018-12-26 ENCOUNTER — Other Ambulatory Visit: Payer: Self-pay

## 2018-12-26 ENCOUNTER — Encounter: Payer: Self-pay | Admitting: Family Medicine

## 2018-12-26 VITALS — BP 133/68 | HR 62 | Wt 221.0 lb

## 2018-12-26 DIAGNOSIS — R002 Palpitations: Secondary | ICD-10-CM | POA: Diagnosis not present

## 2018-12-26 DIAGNOSIS — E291 Testicular hypofunction: Secondary | ICD-10-CM | POA: Diagnosis not present

## 2018-12-26 DIAGNOSIS — Z0000 Encounter for general adult medical examination without abnormal findings: Secondary | ICD-10-CM

## 2018-12-26 DIAGNOSIS — I10 Essential (primary) hypertension: Secondary | ICD-10-CM | POA: Diagnosis not present

## 2018-12-26 HISTORY — DX: Palpitations: R00.2

## 2018-12-26 NOTE — Assessment & Plan Note (Signed)
Discussed with patient having intermittent palpitations not associated with testosterone shots seem to be more associated with caffeine consumption but are very concerning wants to have an EKG.  Has been to the emergency room before for these palpitations also and EKG was normal. Discussed patient has an iPhone discussed iPhone EKGs and recommended Kardia. Also discussed caffeine consumption and palpitations

## 2018-12-26 NOTE — Assessment & Plan Note (Signed)
The current medical regimen is effective;  continue present plan and medications.  

## 2018-12-26 NOTE — Progress Notes (Signed)
BP 133/68   Pulse 62   Wt 221 lb (100.2 kg)   BMI 26.21 kg/m    Subjective:    Patient ID: Dustin [name], male    DOB: Jun 10, 1971, 48 y.o.   MRN: [id number]  HPI: Dustin [name] is a 48 y.o. male  Med check  Discussed with patient medical issues and testosterone doing well with no complaints doing shots every week without problems.  Blood pressure doing well Discussed palpitations see assessment for details.  Relevant past medical, surgical, family and social history reviewed and updated as indicated. Interim medical history since our last visit reviewed. Allergies and medications reviewed and updated.  Review of Systems  Constitutional: Negative.   HENT: Negative.   Eyes: Negative.   Respiratory: Negative.   Cardiovascular: Negative.   Gastrointestinal: Negative.   Endocrine: Negative.   Genitourinary: Negative.   Musculoskeletal: Negative.   Skin: Negative.   Allergic/Immunologic: Negative.   Neurological: Negative.   Hematological: Negative.   Psychiatric/Behavioral: Negative.     Per HPI unless specifically indicated above     Objective:    BP 133/68   Pulse 62   Wt 221 lb (100.2 kg)   BMI 26.21 kg/m   Wt Readings from Last 3 Encounters:  12/26/18 221 lb (100.2 kg)  05/22/18 232 lb (105.2 kg)  05/04/18 225 lb (102.1 kg)    Physical Exam  Results for orders placed or performed in visit on 05/22/18  CBC with Differential/Platelet  Result Value Ref Range   WBC 4.6 3.4 - 10.8 x10E3/uL   RBC 5.18 4.14 - 5.80 x10E6/uL   Hemoglobin 14.3 13.0 - 17.7 g/dL   Hematocrit 43.6 37.5 - 51.0 %   MCV 84 79 - 97 fL   MCH 27.6 26.6 - 33.0 pg   MCHC 32.8 31.5 - 35.7 g/dL   RDW 14.4 12.3 - 15.4 %   Platelets 249 150 - 450 x10E3/uL   Neutrophils 62 Not Estab. %   Lymphs 22 Not Estab. %   Monocytes 10 Not Estab. %   Eos 6 Not Estab. %   Basos 0 Not Estab. %   Neutrophils Absolute 2.8 1.4 - 7.0 x10E3/uL   Lymphocytes Absolute 1.0 0.7 - 3.1 x10E3/uL   Monocytes  Absolute 0.5 0.1 - 0.9 x10E3/uL   EOS (ABSOLUTE) 0.3 0.0 - 0.4 x10E3/uL   Basophils Absolute 0.0 0.0 - 0.2 x10E3/uL   Immature Granulocytes 0 Not Estab. %   Immature Grans (Abs) 0.0 0.0 - 0.1 x10E3/uL  PSA  Result Value Ref Range   Prostate Specific Ag, Serum 0.4 0.0 - 4.0 ng/mL  Testosterone  Result Value Ref Range   Testosterone 974 (H) 264 - 916 ng/dL  Comprehensive metabolic panel  Result Value Ref Range   Glucose 88 65 - 99 mg/dL   BUN 17 6 - 24 mg/dL   Creatinine, Ser 1.19 0.76 - 1.27 mg/dL   GFR calc non Af Amer 73 >59 mL/min/1.73   GFR calc Af Amer 85 >59 mL/min/1.73   BUN/Creatinine Ratio 14 9 - 20   Sodium 138 134 - 144 mmol/L   Potassium 4.0 3.5 - 5.2 mmol/L   Chloride 99 96 - 106 mmol/L   CO2 24 20 - 29 mmol/L   Calcium 9.6 8.7 - 10.2 mg/dL   Total Protein 7.0 6.0 - 8.5 g/dL   Albumin 4.4 3.5 - 5.5 g/dL   Globulin, Total 2.6 1.5 - 4.5 g/dL   Albumin/Globulin Ratio 1.7 1.2 - 2.2  Bilirubin Total 0.4 0.0 - 1.2 mg/dL   Alkaline Phosphatase 34 (L) 39 - 117 IU/L   AST 64 (H) 0 - 40 IU/L   ALT 25 0 - 44 IU/L      Assessment & Plan:   Problem List Items Addressed This Visit      Cardiovascular and Mediastinum   Hypertension    The current medical regimen is effective;  continue present plan and medications.       Relevant Orders   Comprehensive metabolic panel     Endocrine   Male hypogonadism    The current medical regimen is effective;  continue present plan and medications.       Relevant Orders   Testosterone     Other   Palpitations    Discussed with patient having intermittent palpitations not associated with testosterone shots seem to be more associated with caffeine consumption but are very concerning wants to have an EKG.  Has been to the emergency room before for these palpitations also and EKG was normal. Discussed patient has an iPhone discussed iPhone EKGs and recommended Kardia. Also discussed caffeine consumption and palpitations       Relevant Orders   Comprehensive metabolic panel   EKG 81-OFBP    Other Visit Diagnoses    PE (physical exam), annual    -  Primary   Relevant Orders   Comprehensive metabolic panel   Lipid panel   CBC with Differential/Platelet   TSH   PSA   Urinalysis, Routine w reflex microscopic      Telemedicine using audio/video telecommunications for a synchronous communication visit. Today's visit due to COVID-19 isolation precautions I connected with and verified that I am speaking with the correct person using two identifiers.   I discussed the limitations, risks, security and privacy concerns of performing an evaluation and management service by telecommunication and the availability of in person appointments. I also discussed with the patient that there may be a patient responsible charge related to this service. The patient expressed understanding and agreed to proceed. The patient's location is I am at home.   I discussed the assessment and treatment plan with the patient. The patient was provided an opportunity to ask questions and all were answered. The patient agreed with the plan and demonstrated an understanding of the instructions.   The patient was advised to call back or seek an in-person evaluation if the symptoms worsen or if the condition fails to improve as anticipated.   I provided 21+ minutes of time during this encounter.  Follow up plan: Return in about 6 months (around 06/28/2019) for CBC, PSA, testosterone, BMP.

## 2018-12-26 NOTE — Addendum Note (Signed)
Addended by: Gerda Diss A on: 12/26/2018 04:16 PM   Modules accepted: Orders

## 2018-12-27 ENCOUNTER — Encounter: Payer: Self-pay | Admitting: Family Medicine

## 2018-12-27 LAB — LIPID PANEL
Chol/HDL Ratio: 4.1 ratio (ref 0.0–5.0)
Cholesterol, Total: 200 mg/dL — ABNORMAL HIGH (ref 100–199)
HDL: 49 mg/dL (ref >39)
LDL Calculated: 136 mg/dL — ABNORMAL HIGH (ref 0–99)
Triglycerides: 74 mg/dL (ref 0–149)
VLDL Cholesterol Cal: 15 mg/dL (ref 5–40)

## 2018-12-27 LAB — CBC WITH DIFFERENTIAL/PLATELET
Basophils Absolute: 0 10*3/uL (ref 0.0–0.2)
Basos: 0 %
EOS (ABSOLUTE): 0.1 10*3/uL (ref 0.0–0.4)
Eos: 2 %
Hematocrit: 46.3 % (ref 37.5–51.0)
Hemoglobin: 15.1 g/dL (ref 13.0–17.7)
Immature Grans (Abs): 0 10*3/uL (ref 0.0–0.1)
Immature Granulocytes: 0 %
Lymphocytes Absolute: 0.9 10*3/uL (ref 0.7–3.1)
Lymphs: 19 %
MCH: 29.5 pg (ref 26.6–33.0)
MCHC: 32.6 g/dL (ref 31.5–35.7)
MCV: 90 fL (ref 79–97)
Monocytes Absolute: 0.5 10*3/uL (ref 0.1–0.9)
Monocytes: 10 %
Neutrophils Absolute: 3.2 10*3/uL (ref 1.4–7.0)
Neutrophils: 69 %
Platelets: 209 10*3/uL (ref 150–450)
RBC: 5.12 x10E6/uL (ref 4.14–5.80)
RDW: 14.6 % (ref 11.6–15.4)
WBC: 4.7 10*3/uL (ref 3.4–10.8)

## 2018-12-27 LAB — COMPREHENSIVE METABOLIC PANEL
ALT: 23 IU/L (ref 0–44)
AST: 55 IU/L — ABNORMAL HIGH (ref 0–40)
Albumin/Globulin Ratio: 1.8 (ref 1.2–2.2)
Albumin: 4.6 g/dL (ref 4.0–5.0)
Alkaline Phosphatase: 34 IU/L — ABNORMAL LOW (ref 39–117)
BUN/Creatinine Ratio: 20 (ref 9–20)
BUN: 25 mg/dL — ABNORMAL HIGH (ref 6–24)
Bilirubin Total: 0.5 mg/dL (ref 0.0–1.2)
CO2: 23 mmol/L (ref 20–29)
Calcium: 9.3 mg/dL (ref 8.7–10.2)
Chloride: 98 mmol/L (ref 96–106)
Creatinine, Ser: 1.26 mg/dL (ref 0.76–1.27)
GFR calc Af Amer: 79 mL/min/{1.73_m2} (ref >59)
GFR calc non Af Amer: 68 mL/min/{1.73_m2} (ref >59)
Globulin, Total: 2.5 g/dL (ref 1.5–4.5)
Glucose: 78 mg/dL (ref 65–99)
Potassium: 3.9 mmol/L (ref 3.5–5.2)
Sodium: 136 mmol/L (ref 134–144)
Total Protein: 7.1 g/dL (ref 6.0–8.5)

## 2018-12-27 LAB — URINALYSIS, ROUTINE W REFLEX MICROSCOPIC
Bilirubin, UA: NEGATIVE
Glucose, UA: NEGATIVE
Ketones, UA: NEGATIVE
Leukocytes,UA: NEGATIVE
Nitrite, UA: NEGATIVE
Protein,UA: NEGATIVE
RBC, UA: NEGATIVE
Specific Gravity, UA: 1.015 (ref 1.005–1.030)
Urobilinogen, Ur: 0.2 mg/dL (ref 0.2–1.0)
pH, UA: 5.5 (ref 5.0–7.5)

## 2018-12-27 LAB — TSH: TSH: 2.42 u[IU]/mL (ref 0.450–4.500)

## 2018-12-27 LAB — TESTOSTERONE: Testosterone: 777 ng/dL (ref 264–916)

## 2018-12-27 LAB — PSA: Prostate Specific Ag, Serum: 0.4 ng/mL (ref 0.0–4.0)

## 2018-12-31 ENCOUNTER — Encounter: Payer: Self-pay | Admitting: Family Medicine

## 2018-12-31 DIAGNOSIS — R002 Palpitations: Secondary | ICD-10-CM

## 2019-01-01 NOTE — Progress Notes (Signed)
Virtual Visit via Video Note   This visit type was conducted due to national recommendations for restrictions regarding the COVID-19 Pandemic (e.g. social distancing) in an effort to limit this patient's exposure and mitigate transmission in our community.  Due to his co-morbid illnesses, this patient is at least at moderate risk for complications without adequate follow up.  This format is felt to be most appropriate for this patient at this time.  All issues noted in this document were discussed and addressed.  A limited physical exam was performed with this format.  Please refer to the patient's chart for his consent to telehealth for [facility].   I connected with  Dustin [name] on 01/02/19 by a video enabled telemedicine application and verified that I am speaking with the correct person using two identifiers. I discussed the limitations of evaluation and management by telemedicine. The patient expressed understanding and agreed to proceed.   Evaluation Performed:  Follow-up visit  Date:  01/02/2019   ID:  Dustin [name], Dustin [name] 02/24/1971, MRN [id number]  Patient Location:  [place]  [address]   Provider location:   [facility] [place] office  PCP:  Guadalupe Maple, MD  Cardiologist:  Patsy Baltimore   Chief Complaint: Palpitations    History of Present Illness:    Dustin [name] is a 48 y.o. male who presents via audio/video conferencing for a telehealth visit today.   The patient does not symptoms concerning for COVID-19 infection (fever, chills, cough, or new SHORTNESS OF BREATH).   Patient has a past medical history of Hypertension Hypogonadism Hyperlipidemia Referred by Dr. Luvenia Heller for palpitations  intermittent palpitations not associated with testosterone shots  Etiology unclear  Has been to the emergency room before for these palpitations 2019  He does have high caffeine consumption , energy drinks home  Last nov 2019: went  to the ER, dropped beats Drop feeling in chest,  Goes to gym -6 days a week Every 15 beats, dropped a beat, did not feel it  Physical last week, drinking energy drink, BP elevated 678 systolic, concerned about high blood pressure EKG was performed showing PACs , pulse missing when he measures his pulse Anxiety about beats dropping out No chest pain, SOB, good exercise tolerance  Ordered a cardiomobile, has started using this showing ectopy   Dad at 16, cardiac arrest Grandfather with CAD possibly in his 35s     Prior CV studies:   The following studies were reviewed today:    Past Medical History:  Diagnosis Date  . Anemia   . GERD (gastroesophageal reflux disease)   . Hypertension   . Male hypogonadism    Past Surgical History:  Procedure Laterality Date  . TONSILLECTOMY    . TYMPANOSTOMY TUBE PLACEMENT     t     Current Meds  Medication Sig  . amLODipine (NORVASC) 5 MG tablet Take 1 tablet (5 mg total) by mouth daily.  . B-D 3CC LUER-LOK SYR 18GX1-1/2 18G X 1-1/2" 3 ML MISC USE AS DIRECTED WITH TESTOSTERONE  . fluticasone (FLONASE) 50 MCG/ACT nasal spray SPRAY 2 SPRAYS INTO EACH NOSTRIL EVERY DAY  . testosterone cypionate (DEPOTESTOSTERONE CYPIONATE) 200 MG/ML injection INJECT 1ML EVERY 2 WEEKS     Allergies:   Penicillins   Social History   Tobacco Use  . Smoking status: Never Smoker  . Smokeless tobacco: Never Used  Substance Use Topics  . Alcohol use: Yes  .  Drug use: No     Current Outpatient Medications on File Prior to Visit  Medication Sig Dispense Refill  . amLODipine (NORVASC) 5 MG tablet Take 1 tablet (5 mg total) by mouth daily. 90 tablet 4  . B-D 3CC LUER-LOK SYR 18GX1-1/2 18G X 1-1/2" 3 ML MISC USE AS DIRECTED WITH TESTOSTERONE 12 each 0  . fluticasone (FLONASE) 50 MCG/ACT nasal spray SPRAY 2 SPRAYS INTO EACH NOSTRIL EVERY DAY 48 mL 4  . testosterone cypionate (DEPOTESTOSTERONE CYPIONATE) 200 MG/ML injection INJECT 1ML EVERY 2 WEEKS 6 mL  1   No current facility-administered medications on file prior to visit.      Family Hx: The patient's family history includes Heart disease in his father and paternal grandfather; Thyroid disease in his mother.  ROS:   Please see the history of present illness.    Review of Systems  Constitutional: Negative.   HENT: Negative.   Respiratory: Negative.   Cardiovascular: Positive for palpitations.  Gastrointestinal: Negative.   Musculoskeletal: Negative.   Neurological: Negative.   Psychiatric/Behavioral: Negative.   All other systems reviewed and are negative.     Labs/Other Tests and Data Reviewed:    Recent Labs: 12/26/2018: ALT 23; BUN 25; Creatinine, Ser 1.26; Hemoglobin 15.1; Platelets 209; Potassium 3.9; Sodium 136; TSH 2.420   Recent Lipid Panel Lab Results  Component Value Date/Time   CHOL 200 (H) 12/26/2018 04:42 PM   TRIG 74 12/26/2018 04:42 PM   HDL 49 12/26/2018 04:42 PM   CHOLHDL 4.1 12/26/2018 04:42 PM   LDLCALC 136 (H) 12/26/2018 04:42 PM    Wt Readings from Last 3 Encounters:  01/02/19 220 lb (99.8 kg)  12/26/18 221 lb (100.2 kg)  05/22/18 232 lb (105.2 kg)     Exam:    Vital Signs: Vital signs may also be detailed in the HPI BP 133/65   Pulse 62   Ht 6\' 5"  (1.956 m)   Wt 220 lb (99.8 kg)   BMI 26.09 kg/m   Wt Readings from Last 3 Encounters:  01/02/19 220 lb (99.8 kg)  12/26/18 221 lb (100.2 kg)  05/22/18 232 lb (105.2 kg)   Temp Readings from Last 3 Encounters:  05/22/18 98.3 F (36.8 C)  05/04/18 97.9 F (36.6 C) (Oral)  05/31/17 98.2 F (36.8 C) (Oral)   BP Readings from Last 3 Encounters:  01/02/19 133/65  12/26/18 133/68  07/24/18 (!) 146/86   Pulse Readings from Last 3 Encounters:  01/02/19 62  12/26/18 62  07/24/18 64     Well nourished, well developed male in no acute distress. Constitutional:  oriented to person, place, and time. No distress.  Head: Normocephalic and atraumatic.  Eyes:  no discharge. No scleral  icterus.  Neck: Normal range of motion. Neck supple.  Pulmonary/Chest: No audible wheezing, no distress, appears comfortable Musculoskeletal: Normal range of motion.  no  tenderness or deformity.  Neurological:   Coordination normal. Full exam not performed Skin:  No rash Psychiatric:  normal mood and affect. behavior is normal. Thought content normal.    ASSESSMENT & PLAN:    Problem List Items Addressed This Visit      Cardiology Problems   Hypertension     Other   Palpitations - Primary   GERD (gastroesophageal reflux disease)    Other Visit Diagnoses    Family history of coronary artery disease       APC (atrial premature contractions)         Palpitations Having PACs on  EKG Discussed PACs and PVCs, mechanism, treatment Try to alleviate some of his concern He does not want medications for symptom relief, declining beta-blockers  Family history of premature coronary disease Father with cardiac arrest, grandfather with disease Patient's cholesterol is elevated LDL 130s We will order CT coronary calcium score for risk stratification  Abnormal EKG Concern for LVH CT scanning as above We also did discuss echocardiogram, but will start with CT  Hyperlipidemia LDL 130s, discussed ramifications especially in light of family history Suggested we start with calcium scoring for guidance He prefers not to take medications  COVID-19 Education: The signs and symptoms of COVID-19 were discussed with the patient and how to seek care for testing (follow up with PCP or arrange E-visit).  The importance of social distancing was discussed today.  Patient Risk:   After full review of this patients clinical status, I feel that they are at least moderate risk at this time.  Time:   Today, I have spent 45 minutes with the patient with telehealth technology discussing the cardiac and medical problems/diagnoses detailed above   10 min spent reviewing the chart prior to patient visit  today   Medication Adjustments/Labs and Tests Ordered: Current medicines are reviewed at length with the patient today.  Concerns regarding medicines are outlined above.   Tests Ordered: CT coronary calcium scoring has been ordered   Medication Changes: No changes made   Disposition: Follow-up as needed   Signed, Ida Rogue, MD  [place] [facility] [address], [place], [place] [address]

## 2019-01-01 NOTE — Telephone Encounter (Signed)
Phone call Discussed with patient ongoing cardiac concerns does not have heart monitor device for his phone yet but should be coming any day now.  Because of ongoing concerns wants to talk with cardiology so we will go ahead and make referral

## 2019-01-02 ENCOUNTER — Telehealth (INDEPENDENT_AMBULATORY_CARE_PROVIDER_SITE_OTHER): Payer: Managed Care, Other (non HMO) | Admitting: Cardiovascular Disease

## 2019-01-02 ENCOUNTER — Other Ambulatory Visit: Payer: Self-pay

## 2019-01-02 ENCOUNTER — Encounter: Payer: Self-pay | Admitting: Cardiovascular Disease

## 2019-01-02 VITALS — BP 133/65 | HR 62 | Ht 77.0 in | Wt 220.0 lb

## 2019-01-02 DIAGNOSIS — K219 Gastro-esophageal reflux disease without esophagitis: Secondary | ICD-10-CM

## 2019-01-02 DIAGNOSIS — R002 Palpitations: Secondary | ICD-10-CM | POA: Diagnosis not present

## 2019-01-02 DIAGNOSIS — I10 Essential (primary) hypertension: Secondary | ICD-10-CM | POA: Diagnosis not present

## 2019-01-02 DIAGNOSIS — I491 Atrial premature depolarization: Secondary | ICD-10-CM

## 2019-01-02 DIAGNOSIS — Z8249 Family history of ischemic heart disease and other diseases of the circulatory system: Secondary | ICD-10-CM | POA: Diagnosis not present

## 2019-01-02 NOTE — Patient Instructions (Addendum)
Monitor heart rate  We can use b-blocker if needed for PACs   Medication Instructions:  No changes  If you need a refill on your cardiac medications before your next appointment, please call your pharmacy.   Lab work: No new labs needed   If you have labs (blood work) drawn today and your tests are completely normal, you will receive your results only by: Marland Kitchen MyChart Message (if you have MyChart) OR . A paper copy in the mail If you have any lab test that is abnormal or we need to change your treatment, we will call you to review the results.   Testing/Procedures: We will order CT coronary calcium score $150 Family History     Please call [phone] to schedule   [facility] [address], [place] [address]   Follow-Up: At [facility], you and your health needs are our priority.  As part of our continuing mission to provide you with exceptional heart care, we have created designated Provider Care Teams.  These Care Teams include your primary Cardiologist (physician) and Advanced Practice Providers (APPs -  Physician Assistants and Nurse Practitioners) who all work together to provide you with the care you need, when you need it.  . You will need a follow up appointment as needed  . Providers on your designated Care Team:   . Murray Hodgkins, NP . Christell Faith, PA-C . Marrianne Mood, PA-C  Any Other Special Instructions Will Be Listed Below (If Applicable).  For educational health videos Log in to : [email or web address] Or : [email or web address], password : [email or web address]

## 2019-01-04 ENCOUNTER — Encounter: Payer: Self-pay | Admitting: Family Medicine

## 2019-01-07 ENCOUNTER — Other Ambulatory Visit: Payer: Self-pay | Admitting: Family Medicine

## 2019-01-07 MED ORDER — TESTOSTERONE ENANTHATE 200 MG/ML IJ SOLN: 200.0000 mg | INTRAMUSCULAR | 1 refills | Status: DC

## 2019-01-07 NOTE — Progress Notes (Signed)
Patient request changed to testosterone ethanolate and will investigate if he wants lipoprotein fractions

## 2019-01-14 ENCOUNTER — Telehealth: Payer: Self-pay | Admitting: Cardiovascular Disease

## 2019-01-14 NOTE — Telephone Encounter (Signed)
Attempted to call patient. LMTCB 01/14/2019   

## 2019-01-14 NOTE — Telephone Encounter (Signed)
Call to patient. He reports irregular heartbeat seen on EKG at PCP office. He had virtual appt with Dr. Rockey Situ 7/22 and was scheduled for Ca scoring test at the end of august.   Pt reports HTN and anxiety. The electronic pump at home is giving a "pt moving warning". He thinks it may not be working correctly d/t irregular heartbeat.   I think it would be appropriate normally to have pt come in for a nurse visit but d/t current clinic guidelines this is not possible. I scheduled pt to come in for OV with Christell Faith, PA 01/15/19.   Pt cooperative with POC. I reviewed clinic procedure during CV, he verbalized agreement.       COVID-19 Pre-Screening Questions:  . In the past 7 to 10 days have you had a cough,  shortness of breath, headache, congestion, fever (100 or greater) body aches, chills, sore throat, or sudden loss of taste or sense of smell? no . Have you been around anyone with known Covid 19. no . Have you been around anyone who is awaiting Covid 19 test results in the past 7 to 10 days? no . Have you been around anyone who has been exposed to Covid 19, or has mentioned symptoms of Covid 19 within the past 7 to 10 days? no  If you have any concerns/questions about symptoms patients report during screening (either on the phone or at threshold). Contact the provider seeing the patient or DOD for further guidance.  If neither are available contact a member of the leadership team.

## 2019-01-14 NOTE — Telephone Encounter (Signed)
Patient is returning your call.  

## 2019-01-15 ENCOUNTER — Ambulatory Visit (INDEPENDENT_AMBULATORY_CARE_PROVIDER_SITE_OTHER): Payer: Managed Care, Other (non HMO) | Admitting: Physician Assistant

## 2019-01-15 ENCOUNTER — Other Ambulatory Visit: Payer: Self-pay

## 2019-01-15 ENCOUNTER — Ambulatory Visit (INDEPENDENT_AMBULATORY_CARE_PROVIDER_SITE_OTHER): Payer: Managed Care, Other (non HMO)

## 2019-01-15 ENCOUNTER — Encounter: Payer: Self-pay | Admitting: Physician Assistant

## 2019-01-15 VITALS — BP 148/90 | HR 74 | Ht 77.0 in | Wt 223.5 lb

## 2019-01-15 DIAGNOSIS — R002 Palpitations: Secondary | ICD-10-CM | POA: Diagnosis not present

## 2019-01-15 DIAGNOSIS — Z8249 Family history of ischemic heart disease and other diseases of the circulatory system: Secondary | ICD-10-CM | POA: Diagnosis not present

## 2019-01-15 DIAGNOSIS — I10 Essential (primary) hypertension: Secondary | ICD-10-CM | POA: Diagnosis not present

## 2019-01-15 DIAGNOSIS — E785 Hyperlipidemia, unspecified: Secondary | ICD-10-CM

## 2019-01-15 MED ORDER — DILTIAZEM HCL ER 120 MG PO CP12: 120.0000 mg | ORAL_CAPSULE | Freq: Every day | ORAL | 3 refills | Status: DC

## 2019-01-15 NOTE — Progress Notes (Signed)
Cardiology Clinic Note   Patient Name: Dustin [name] Date of Encounter: 01/15/2019  Primary Care Provider:  Guadalupe Maple, MD Primary Cardiologist:  Dustin [name].  Patient Profile    49 yo male here for follow-up of palpitations and HTN.  Past Medical History    Past Medical History:  Diagnosis Date   Anemia    GERD (gastroesophageal reflux disease)    Hypertension    Male hypogonadism    Past Surgical History:  Procedure Laterality Date   TONSILLECTOMY     TYMPANOSTOMY TUBE PLACEMENT     t    Allergies  Allergies  Allergen Reactions   Penicillins Rash    History of Present Illness    Dustin [name] is a 48 yo male with history of palpitations, HTN, HLD, and hypogonadism.    He presented to [facility] ED in 2019 for palpitations. At that time, he worked out regularly with PACs noted every few days.  He reported that, if he checked his pulse, it appeared as if he skipped a beat every 10 beats.  He reported that his main symptom with these palpitations was " a feeling of a drop in his chest."  He denied any associated chest pain or shortness of breath.  EKG was reviewed and NSR at 68bpm, RAD with LVH but without any acute ST or T changes and blood workup without electrolyte abnormalities or concerning troponin elevation.   He was seen 01/02/2019 by [facility] for ongoing c/o palpitations and per referral by his PCP. He was noted to have symptomatic intermittent palpitations that were not associated with his testosterone shots and for which etiology was unclear at that time. He reported that he had finally decided to see a cardiologist after he felt his palpations and home BP measurements had worsened and remained that way, which he attributed to an energy drink that he consumed earlier that month (~7/15). Following this energy drink, he felt "off." Immediately after the drink, he took his blood pressure with his systolic blood pressure in the 170s. This  caused him anxiety, and since that time, he has not felt himself with "frequent feeling of dropped beats in his chest." He reported heavy caffeine consumption, including energy drinks, and he was advised to cut back on both.  He was started on low dose amlodipine, as he indicated he did not wish to start a beta blocker. He was also advised to get Kardia for assistance in monitoring his rate and rhythm at home. Given his family history of CAD, he was scheduled for CAC scoring to take place 02/06/2019.  Since that time, the patient has cut back on caffeine and obtained Kardia. However, he reported little relief with the amlodipine and in fact felt worsening of his symptoms.  Today, he is seen for follow-up and c/o increasing palpitations and elevated blood pressure readings at home.  He reported that this often feels better with "less feelings of dropped beats" during exercise or any other activity that takes his mind off of the skipped beats.  He denied feeling any feelings of rapid heart rate; however, he did note that his "heart seemed to pound whenever he laid down flat before bed / when attempting to sleep." He reported that this increasingly interfered with his getting to sleep. He denied any sx of presyncope or syncope. 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 He confirmed that he has not had any energy drinks since his visit with Dustin [name] 7/22.  He had also cut down on other sources of caffeine in general as well, drinking only 1 cup of iced tea a day.  He reported that he slept well (other than his difficulty falling asleep due to his "pounding heart") and continued to workout around 4 AM each day without any chest pain or SOB/DOE.  In fact, he reported improved symptoms / less "dropped beats" with his workouts. He noted his SBP increased after workouts into the 160s but otherwise his home  SBP in the 140s, whereas it was previously in the 130s for him.  He continued to note concern over his family history with father deceased of cardiac arrest at age 79 and grandfather with CAD, possibly in his 50s.  He also noted concern over his most recent labs with LDL 136. At follow-up, EKG showed NSR with frequent PVCs and PACs and rate 74bpm, LVH, but otherwise Dustin acute ST/T changes from previous.   Home Medications    Prior to Admission medications   Medication Sig Start Date End Date Taking? Authorizing Provider  amLODipine (NORVASC) 5 MG tablet Take 1 tablet (5 mg total) by mouth daily. 11/14/17   Dustin Maple, MD  B-D 3CC LUER-LOK SYR 18GX1-1/2 18G X 1-1/2" 3 ML MISC USE AS DIRECTED WITH TESTOSTERONE 11/17/14   Dustin [name], Dustin [name], Dustin [name]  fluticasone (FLONASE) 50 MCG/ACT nasal spray SPRAY 2 SPRAYS INTO EACH NOSTRIL EVERY DAY 12/26/18   Dustin Maple, MD  Testosterone Enanthate 200 MG/ML SOLN Inject 200 mg as directed every 14 (fourteen) days. 01/07/19   Dustin Maple, MD    Family History    Family History  Problem Relation Age of Onset   Thyroid disease Mother    Heart disease Father    Heart disease Paternal Grandfather    He indicated that his mother is alive. He indicated that his father is deceased. He indicated that the status of his paternal grandfather is unknown.  Social History    Social History   Socioeconomic History   Marital status: Married    Spouse name: Not on [name]   Number of children: Not on [name]   Years of education: Not on [name]   Highest education level: Not on [name]  Occupational History   Not on [name]  Social Needs   Financial resource strain: Not on [name]   Food insecurity    Worry: Not on [name]    Inability: Not on [name]   Transportation needs    Medical: Not on [name]    Non-medical: Not on [name]  Tobacco Use   Smoking status: Never Smoker   Smokeless tobacco: Never Used  Substance and Sexual Activity   Alcohol use: Yes   Drug  use: Dustin   Sexual activity: Not on [name]  Lifestyle   Physical activity    Days per week: Not on [name]    Minutes per session: Not on [name]   Stress: Not on [name]  Relationships   Social connections    Talks on phone: Not on [name]    Gets together: Not on [name]    Attends religious service: Not on [name]    Active member of club or organization: Not on [name]    Attends meetings of clubs or organizations: Not on [name]    Relationship status: Not on [name]   Intimate partner violence    Fear  of current or ex partner: Not on [name]    Emotionally abused: Not on [name]    Physically abused: Not on [name]    Forced sexual activity: Not on [name]  Other Topics Concern   Not on [name]  Social History Narrative   Not on [name]     Review of Systems    He denies chest pain, dyspnea, pnd, orthopnea, n, v, dizziness, syncope, edema, weight gain, or early satiety.   Physical Exam    VS:  There were Dustin vitals taken for this visit. , BMI There is Dustin height or weight on [name] to calculate BMI. GEN: Well nourished, well developed, in Dustin acute distress. HEENT: normal. Neck: Supple, Dustin JVD, carotid bruits, or masses. Cardiac: RRR, extrasystole appreciated, Dustin murmurs, rubs, or gallops. Dustin clubbing, cyanosis, edema.  Radials/DP/PT 2+ and equal bilaterally.  Respiratory:  Respirations regular and unlabored, clear to auscultation bilaterally. GI: Soft, nontender, nondistended, BS + x 4. MS: Dustin deformity or atrophy. Skin: warm and dry, Dustin rash. Neuro:  Strength and sensation are intact. Psych: Normal affect.  Accessory Clinical Findings    ECG personally reviewed by me today-NSR with frequent PACs and PVCs (increased from previous EKG), 74 bpm, LVH, Dustin acute ST or T wave changes- Dustin acute changes  Assessment & Plan   Palpitations, Progressive PACs / PVCs --Progressive sx with more frequent PACs/PVCs on today's EKG.  --Stop Amlodipine 5mg  daily. --Start Cardizem 120mg  qd.  --3 day Zio placed to assess  burden of ectopy and r/o arrhythmia, not captured on EKG. Given his symptoms are interfering with his life at this time, the benefit of starting the diltiazem before receipt of official Elwyn Reach results are processed outweigh the fact that this medication will likely decrease the overall frequency in PACs and PVCs before starting the medication. --Ordered repeat CMP and magnesium in the office to assess electrolytes / renal function.  TSH checked earlier and within range. -- Scheduled echo within 1 month to assess ectopy burden. If significantly decreased EF or concerning structural changes, will need further ischemic evaluation at that time and including changing cath. For now, will plan for Zio and echocardiogram with follow-up with Dustin [name] in 1 month to go over the results.  HLD --Family history of CAD. --LDL 136, TG 74, total cholesterol 200. --Will defer initiation of statin today and pending results of calcium score given statin therapy can stabilize potential unstable atherosclerosis thus falsely elevating calcium score. Patient to follow-up with primary cardiologist.   HTN --EKG showed LVH --Echo as above.  --Continue diltiazem  Arvil Chaco, PA-C 01/15/2019, 1:19 PM

## 2019-01-15 NOTE — Patient Instructions (Signed)
Medication Instructions:  Your physician has recommended you make the following change in your medication:  1- STOP Amlodipine 2- START Diltiazem Take 1 capsule (120 mg total) by mouth daily. If you need a refill on your cardiac medications before your next appointment, please call your pharmacy.   Lab work: Your physician recommends that you have lab work today(CMP, Mag) If you have labs (blood work) drawn today and your tests are completely normal, you will receive your results only by: Marland Kitchen MyChart Message (if you have MyChart) OR . A paper copy in the mail If you have any lab test that is abnormal or we need to change your treatment, we will call you to review the results.  Testing/Procedures: 1- A zio monitor was placed today. It will remain on for 3 days. You will then return monitor and event diary in provided box. It takes 1-2 weeks for report to be downloaded and returned to [place]. We will call you with the results. If monitor falls of or has orange flashing light, please call Zio for further instructions.   2- Echo  Please return to [facility] on ______________ at _______________ AM/PM for an Echocardiogram. Your physician has requested that you have an echocardiogram. Echocardiography is a painless test that uses sound waves to create images of your heart. It provides your doctor with information about the size and shape of your heart and how well your heart's chambers and valves are working. This procedure takes approximately one hour. There are no restrictions for this procedure. Please note; depending on visual quality an IV may need to be placed.    Follow-Up: At [facility], you and your health needs are our priority.  As part of our continuing mission to provide you with exceptional heart care, we have created designated Provider Care Teams.  These Care Teams include your primary Cardiologist (physician) and Advanced Practice Providers (APPs -  Physician Assistants  and Nurse Practitioners) who all work together to provide you with the care you need, when you need it. You will need a follow up appointment in 1 months.  Please establish primary cardiologist at your next [place].

## 2019-01-16 ENCOUNTER — Other Ambulatory Visit: Payer: Self-pay | Admitting: Physician Assistant

## 2019-01-16 DIAGNOSIS — I491 Atrial premature depolarization: Secondary | ICD-10-CM

## 2019-01-16 LAB — COMPREHENSIVE METABOLIC PANEL
ALT: 28 IU/L (ref 0–44)
AST: 62 IU/L — ABNORMAL HIGH (ref 0–40)
Albumin/Globulin Ratio: 1.9 (ref 1.2–2.2)
Albumin: 4.7 g/dL (ref 4.0–5.0)
Alkaline Phosphatase: 34 IU/L — ABNORMAL LOW (ref 39–117)
BUN/Creatinine Ratio: 16 (ref 9–20)
BUN: 20 mg/dL (ref 6–24)
Bilirubin Total: 0.5 mg/dL (ref 0.0–1.2)
CO2: 25 mmol/L (ref 20–29)
Calcium: 9.2 mg/dL (ref 8.7–10.2)
Chloride: 99 mmol/L (ref 96–106)
Creatinine, Ser: 1.28 mg/dL — ABNORMAL HIGH (ref 0.76–1.27)
GFR calc Af Amer: 77 mL/min/{1.73_m2} (ref >59)
GFR calc non Af Amer: 67 mL/min/{1.73_m2} (ref >59)
Globulin, Total: 2.5 g/dL (ref 1.5–4.5)
Glucose: 85 mg/dL (ref 65–99)
Potassium: 4.2 mmol/L (ref 3.5–5.2)
Sodium: 139 mmol/L (ref 134–144)
Total Protein: 7.2 g/dL (ref 6.0–8.5)

## 2019-01-16 LAB — MAGNESIUM: Magnesium: 2.3 mg/dL (ref 1.6–2.3)

## 2019-01-19 ENCOUNTER — Other Ambulatory Visit: Payer: Self-pay | Admitting: Family Medicine

## 2019-01-19 DIAGNOSIS — I10 Essential (primary) hypertension: Secondary | ICD-10-CM

## 2019-01-24 ENCOUNTER — Telehealth: Payer: Self-pay | Admitting: Cardiovascular Disease

## 2019-01-24 MED ORDER — AMLODIPINE BESYLATE 5 MG PO TABS: 5.0000 mg | ORAL_TABLET | Freq: Every day | ORAL | 1 refills | Status: DC

## 2019-01-24 NOTE — Telephone Encounter (Signed)
Pt c/o medication issue:  1. Name of Medication: diltiazem  2. How are you currently taking this medication (dosage and times per day)? 120 mg (possible)  3. Are you having a reaction (difficulty breathing--STAT)? Dizzy, falling down, weakness  4. What is your medication issue? Feeling horrible  Patient thinks that the bad feelings are due to changing the medication about a week ago. Please advise

## 2019-01-24 NOTE — Telephone Encounter (Signed)
Spoke with the patient. Patient was started on Diltiazem at his 01/15/19 o/v with Jacquelyn Visser,PA. Patient has since sent several mychart messages regarding his concern for interactions and side effects regarding the medication. Patient sts that he was not able to tolerate Diltiazem. It made him "feel bad." pt sts that while on the medication he experienced dizziness and fatigue. He has not taken the medication in 2 days and reports he feels a little better. Amlodipine was stopped at his 8/4 visit. He was going to restart it on his own but he discarded the bottle after picking up the new medication.  Patient was also inquiring on his zio monitor results. Adv the pt that his monitor results aren't available as yet and that we will contact him with the results once they become available.  Adv the pt that Dr.Gollan is out of the office. I will talk with one of our PA's regarding medication advice and then call him back to discuss.  Discussed with Noni Saupe., PA. Patient can resume Amlodipine at previous dosage and f/u with testing and Dr.Gollan as planned. Adv the pt that any additional medication change can be addressed when he sees Dr. Rockey Situ on 03/02/19.  Pt made aware and agreeable with the plan.

## 2019-01-27 NOTE — Telephone Encounter (Signed)
Would suggest he talk with pharmacy about polypharmacy In general not a good idea to mix so many things, etoh, THC, quinine, etc

## 2019-01-29 ENCOUNTER — Other Ambulatory Visit: Payer: Self-pay

## 2019-02-04 ENCOUNTER — Other Ambulatory Visit: Payer: Self-pay

## 2019-02-04 ENCOUNTER — Ambulatory Visit (INDEPENDENT_AMBULATORY_CARE_PROVIDER_SITE_OTHER): Payer: Managed Care, Other (non HMO)

## 2019-02-04 DIAGNOSIS — I491 Atrial premature depolarization: Secondary | ICD-10-CM | POA: Diagnosis not present

## 2019-02-06 ENCOUNTER — Ambulatory Visit (INDEPENDENT_AMBULATORY_CARE_PROVIDER_SITE_OTHER)
Admission: RE | Admit: 2019-02-06 | Discharge: 2019-02-06 | Disposition: A | Discharge status: Home / Self Care | Payer: Self-pay | Source: Ambulatory Visit | Attending: Cardiovascular Disease | Admitting: Cardiovascular Disease

## 2019-02-06 ENCOUNTER — Other Ambulatory Visit: Payer: Self-pay

## 2019-02-06 DIAGNOSIS — Z8249 Family history of ischemic heart disease and other diseases of the circulatory system: Secondary | ICD-10-CM

## 2019-02-07 ENCOUNTER — Encounter: Payer: Self-pay | Admitting: Family Medicine

## 2019-02-07 ENCOUNTER — Ambulatory Visit (INDEPENDENT_AMBULATORY_CARE_PROVIDER_SITE_OTHER): Payer: 59 | Admitting: Family Medicine

## 2019-02-07 DIAGNOSIS — J019 Acute sinusitis, unspecified: Secondary | ICD-10-CM | POA: Diagnosis not present

## 2019-02-07 DIAGNOSIS — J329 Chronic sinusitis, unspecified: Secondary | ICD-10-CM | POA: Insufficient documentation

## 2019-02-07 MED ORDER — AZITHROMYCIN 250 MG PO TABS: ORAL_TABLET | ORAL | 0 refills | Status: DC

## 2019-02-07 NOTE — Progress Notes (Signed)
There were no vitals taken for this visit.   Subjective:    Patient ID: Dustin [name], male    DOB: 07/28/1970, 48 y.o.   MRN: [id number]  HPI: Dustin [name] is a 48 y.o. male  Sinus preasure Patient was symptoms getting worse and going on for over a week.  A lot of congestion drainage has tried some OTCs but nothing faithfully and does not seem to work.  Relevant past medical, surgical, family and social history reviewed and updated as indicated. Interim medical history since our last visit reviewed. Allergies and medications reviewed and updated.  Review of Systems  Constitutional: Positive for chills, diaphoresis and fatigue. Negative for fever.  HENT: Positive for congestion, ear pain, postnasal drip, rhinorrhea, sinus pressure, sinus pain, sneezing and sore throat.   Respiratory: Positive for cough. Negative for shortness of breath.   Cardiovascular: Negative.     Per HPI unless specifically indicated above     Objective:    There were no vitals taken for this visit.  Wt Readings from Last 3 Encounters:  01/15/19 223 lb 8 oz (101.4 kg)  01/02/19 220 lb (99.8 kg)  12/26/18 221 lb (100.2 kg)    Physical Exam  Results for orders placed or performed in visit on 01/15/19  Comprehensive metabolic panel  Result Value Ref Range   Glucose 85 65 - 99 mg/dL   BUN 20 6 - 24 mg/dL   Creatinine, Ser 1.28 (H) 0.76 - 1.27 mg/dL   GFR calc non Af Amer 67 >59 mL/min/1.73   GFR calc Af Amer 77 >59 mL/min/1.73   BUN/Creatinine Ratio 16 9 - 20   Sodium 139 134 - 144 mmol/L   Potassium 4.2 3.5 - 5.2 mmol/L   Chloride 99 96 - 106 mmol/L   CO2 25 20 - 29 mmol/L   Calcium 9.2 8.7 - 10.2 mg/dL   Total Protein 7.2 6.0 - 8.5 g/dL   Albumin 4.7 4.0 - 5.0 g/dL   Globulin, Total 2.5 1.5 - 4.5 g/dL   Albumin/Globulin Ratio 1.9 1.2 - 2.2   Bilirubin Total 0.5 0.0 - 1.2 mg/dL   Alkaline Phosphatase 34 (L) 39 - 117 IU/L   AST 62 (H) 0 - 40 IU/L   ALT 28 0 - 44 IU/L  Magnesium  Result Value  Ref Range   Magnesium 2.3 1.6 - 2.3 mg/dL      Assessment & Plan:   Problem List Items Addressed This Visit      Respiratory   Sinusitis    Discussed sinusitis care and treatment use of azithromycin over-the-counter medications      Relevant Medications   azithromycin (ZITHROMAX) 250 MG tablet      Telemedicine using audio/video telecommunications for a synchronous communication visit. Today's visit due to COVID-19 isolation precautions I connected with and verified that I am speaking with the correct person using two identifiers.   I discussed the limitations, risks, security and privacy concerns of performing an evaluation and management service by telecommunication and the availability of in person appointments. I also discussed with the patient that there may be a patient responsible charge related to this service. The patient expressed understanding and agreed to proceed. The patient's location is work. I am at home.   I discussed the assessment and treatment plan with the patient. The patient was provided an opportunity to ask questions and all were answered. The patient agreed with the plan and demonstrated an understanding of the instructions.  The patient was advised to call back or seek an in-person evaluation if the symptoms worsen or if the condition fails to improve as anticipated.   I provided 21+ minutes of time during this encounter.  Follow up plan: Return if symptoms worsen or fail to improve, for As scheduled.

## 2019-02-07 NOTE — Assessment & Plan Note (Signed)
Discussed sinusitis care and treatment use of azithromycin over-the-counter medications

## 2019-02-08 ENCOUNTER — Telehealth: Payer: Self-pay

## 2019-02-08 NOTE — Telephone Encounter (Signed)
-----   Message from Arvil Chaco, PA-C sent at 02/07/2019  7:57 PM EDT ----- Please let Mr. Dustin [name] know that his echocardiogram showed normal pump function of the heart. The top and bottom chambers of the heart were measured as slightly larger than normal; however, the walls were of normal thickness, relaxed normally, and had normal pressures. A small or trivial amount of leak was noted from the mitral /tricuspid /and aortic valves, which is not an unusual or concerning finding. Overall, a very normal and reassuring study.

## 2019-02-08 NOTE — Telephone Encounter (Signed)
Reviewed results of echo. All questions were answered.  Advised pt to call for any further questions or concerns.

## 2019-02-15 ENCOUNTER — Other Ambulatory Visit: Payer: Self-pay | Admitting: Cardiovascular Disease

## 2019-02-18 NOTE — Progress Notes (Signed)
Cardiology Office Note  Date:  02/20/2019   ID:  Dustin, [name] 08-13-1970, MRN [id number]  PCP:  Dustin Maple, MD   Chief Complaint  Patient presents with  . Other    1 month follow up. Meds reviewed verbally with patient.     HPI:    Patient has a past medical history of Hypertension Hypogonadism Hyperlipidemia Referred by Dr. Luvenia Heller for palpitations Last seen by myself telemedicine visit January 02, 2019  On his last clinic visit, APCs on EKG Declined beta-blockers Lab work reviewed with him , LDL 130, higher than his baseline, had not been working out  Recent CT coronary calcium score reviewed Calcium score of 0 February 06, 2019  Echocardiogram reviewed with him showing normal LV function no significant valve disease  Presented for follow-up in the office January 15, 2019 Reported having increased palpitations, hypertension Problems with his sleep Amlodipine was held and he was started on Cardizem 120 mg daily ZIO monitor was ordered He did not tolerate Cardizem, felt washed out and stopped the medication went back on amlodipine  Zio monitor reviewed with him in detail showing normal sinus rhythm Patient triggered events associated with APCs and bigeminal pattern, sometimes PVCs in a bigeminal pattern No other significant arrhythmia noted  Since that time he reports palpitations have resolved He is monitoring heart rate at home, no arrhythmia noted He did change his diet, back to working out again Overall feels good with no complaints  EKG personally reviewed by myself on todays visit Shows normal sinus rhythm with rate 70 bpm no significant ST or T wave changes  Dad at 48, cardiac arrest Grandfather with CAD possibly in his 73s   PMH:   has a past medical history of Anemia, GERD (gastroesophageal reflux disease), Hypertension, and Male hypogonadism.  PSH:    Past Surgical History:  Procedure Laterality Date  . TONSILLECTOMY    . TYMPANOSTOMY TUBE  PLACEMENT     t    Current Outpatient Medications  Medication Sig Dispense Refill  . amLODipine (NORVASC) 5 MG tablet Take 1 tablet (5 mg total) by mouth daily. 30 tablet 1  . B-D 3CC LUER-LOK SYR 18GX1-1/2 18G X 1-1/2" 3 ML MISC USE AS DIRECTED WITH TESTOSTERONE 12 each 0  . finasteride (PROPECIA) 1 MG tablet Take 1 mg by mouth daily.    . fluticasone (FLONASE) 50 MCG/ACT nasal spray SPRAY 2 SPRAYS INTO EACH NOSTRIL EVERY DAY 48 mL 4  . terbinafine (LAMISIL) 250 MG tablet Take 250 mg by mouth as directed.     . Testosterone Enanthate 200 MG/ML SOLN Inject 200 mg as directed every 14 (fourteen) days. 6 mL 1   No current facility-administered medications for this visit.      Allergies:   Penicillins   Social History:  The patient  reports that he has never smoked. He has never used smokeless tobacco. He reports current alcohol use. He reports that he does not use drugs.   Family History:   family history includes Heart disease in his father and paternal grandfather; Thyroid disease in his mother.    Review of Systems: Review of Systems  Constitutional: Negative.   HENT: Negative.   Respiratory: Negative.   Cardiovascular: Negative.   Gastrointestinal: Negative.   Musculoskeletal: Negative.   Neurological: Negative.   Psychiatric/Behavioral: Negative.   All other systems reviewed and are negative.    PHYSICAL EXAM: VS:  BP 134/80 (BP Location: Left Arm, Patient Position: Sitting, Cuff Size:  Normal)   Pulse 70   Ht 6\' 5"  (1.956 m)   Wt 223 lb (101.2 kg)   BMI 26.44 kg/m  , BMI Body mass index is 26.44 kg/m. GEN: Well nourished, well developed, in no acute distress HEENT: normal Neck: no JVD, carotid bruits, or masses Cardiac: RRR; no murmurs, rubs, or gallops,no edema  Respiratory:  clear to auscultation bilaterally, normal work of breathing GI: soft, nontender, nondistended, + BS MS: no deformity or atrophy Skin: warm and dry, no rash Neuro:  Strength and sensation  are intact Psych: euthymic mood, full affect   Recent Labs: 12/26/2018: Hemoglobin 15.1; Platelets 209; TSH 2.420 01/15/2019: ALT 28; BUN 20; Creatinine, Ser 1.28; Magnesium 2.3; Potassium 4.2; Sodium 139    Lipid Panel Lab Results  Component Value Date   CHOL 200 (H) 12/26/2018   HDL 49 12/26/2018   LDLCALC 136 (H) 12/26/2018   TRIG 74 12/26/2018      Wt Readings from Last 3 Encounters:  02/20/19 223 lb (101.2 kg)  01/15/19 223 lb 8 oz (101.4 kg)  01/02/19 220 lb (99.8 kg)      ASSESSMENT AND PLAN:  Problem List Items Addressed This Visit      Cardiology Problems   Hypertension     Other   Palpitations - Primary   Relevant Orders   EKG 12-Lead    Other Visit Diagnoses    Anxiety       APC (atrial premature contractions)       PVC (premature ventricular contraction)         PACs, bigeminal rhythm Seen on monitor Currently asymptomatic, He does not want to have beta-blockers on a regular basis Did not tolerate diltiazem extended release, felt washed out on 120 mg daily -We have given him some by systolic and recommended he take 5 to 10 mg as needed for any breakthrough arrhythmia -For now he will continue on amlodipine alone  Essential hypertension Blood pressure stable, continue Norvasc 5 daily  Disposition:   F/U as needed  Long discussion with him concerning CT coronary calcium scoring, event monitor, echocardiogram, types of arrhythmias, management of ectopy  Total encounter time more than 25 minutes  Greater than 50% was spent in counseling and coordination of care with the patient    Signed, Esmond Plants, M.D., Ph.D. [place], [place]

## 2019-02-20 ENCOUNTER — Ambulatory Visit (INDEPENDENT_AMBULATORY_CARE_PROVIDER_SITE_OTHER): Payer: Managed Care, Other (non HMO) | Admitting: Cardiovascular Disease

## 2019-02-20 ENCOUNTER — Other Ambulatory Visit: Payer: Self-pay

## 2019-02-20 ENCOUNTER — Encounter: Payer: Self-pay | Admitting: Cardiovascular Disease

## 2019-02-20 VITALS — BP 134/80 | HR 70 | Ht 77.0 in | Wt 223.0 lb

## 2019-02-20 DIAGNOSIS — R002 Palpitations: Secondary | ICD-10-CM

## 2019-02-20 DIAGNOSIS — I491 Atrial premature depolarization: Secondary | ICD-10-CM

## 2019-02-20 DIAGNOSIS — I10 Essential (primary) hypertension: Secondary | ICD-10-CM

## 2019-02-20 DIAGNOSIS — I493 Ventricular premature depolarization: Secondary | ICD-10-CM

## 2019-02-20 DIAGNOSIS — F419 Anxiety disorder, unspecified: Secondary | ICD-10-CM | POA: Diagnosis not present

## 2019-02-20 NOTE — Progress Notes (Signed)
Medication Samples have been provided to the patient.  Drug name: Bystolic       Strength: 20 mg        Qty: 1 box  LOT: [id number]  Exp.Date: 02/22  Dosing instructions: 1/2 tablet as needed.

## 2019-02-20 NOTE — Patient Instructions (Signed)

## 2019-02-24 ENCOUNTER — Other Ambulatory Visit: Payer: Self-pay | Admitting: Podiatry

## 2019-03-18 ENCOUNTER — Other Ambulatory Visit: Payer: Self-pay | Admitting: Cardiovascular Disease

## 2019-04-07 ENCOUNTER — Other Ambulatory Visit: Payer: Self-pay | Admitting: Podiatry

## 2019-06-20 ENCOUNTER — Other Ambulatory Visit: Payer: Self-pay

## 2019-06-20 MED ORDER — TESTOSTERONE ENANTHATE 200 MG/ML IJ SOLN: 200.0000 mg | INTRAMUSCULAR | 1 refills | Status: DC

## 2019-06-20 NOTE — Telephone Encounter (Signed)
[facility] faxed a Rx refill request on testosterone enan 1000mg /33ml

## 2019-07-08 ENCOUNTER — Ambulatory Visit: Payer: Self-pay | Admitting: Family Medicine

## 2019-09-04 ENCOUNTER — Other Ambulatory Visit: Payer: Self-pay | Admitting: Cardiovascular Disease

## 2019-10-06 ENCOUNTER — Other Ambulatory Visit: Payer: Self-pay | Admitting: Podiatry

## 2019-10-23 ENCOUNTER — Other Ambulatory Visit: Payer: Self-pay | Admitting: Family Medicine

## 2019-10-23 NOTE — Telephone Encounter (Signed)
Requested medications are due for refill today?  Yes  Requested medications are on active medication list?  Yes  Last Refill:  06/20/2019  6 ml with 1 refill   Future visit scheduled?  No   Notes to Clinic:  Medication is not assigned to a protocol.

## 2019-11-05 ENCOUNTER — Encounter: Payer: Self-pay | Admitting: Dermatology

## 2019-11-05 ENCOUNTER — Ambulatory Visit (INDEPENDENT_AMBULATORY_CARE_PROVIDER_SITE_OTHER): Payer: Managed Care, Other (non HMO) | Admitting: Dermatology

## 2019-11-05 ENCOUNTER — Other Ambulatory Visit: Payer: Self-pay

## 2019-11-05 DIAGNOSIS — L814 Other melanin hyperpigmentation: Secondary | ICD-10-CM

## 2019-11-05 DIAGNOSIS — L821 Other seborrheic keratosis: Secondary | ICD-10-CM | POA: Diagnosis not present

## 2019-11-05 DIAGNOSIS — D229 Melanocytic nevi, unspecified: Secondary | ICD-10-CM

## 2019-11-05 DIAGNOSIS — B351 Tinea unguium: Secondary | ICD-10-CM | POA: Diagnosis not present

## 2019-11-05 DIAGNOSIS — Z1283 Encounter for screening for malignant neoplasm of skin: Secondary | ICD-10-CM | POA: Diagnosis not present

## 2019-11-05 DIAGNOSIS — L578 Other skin changes due to chronic exposure to nonionizing radiation: Secondary | ICD-10-CM

## 2019-11-05 DIAGNOSIS — D224 Melanocytic nevi of scalp and neck: Secondary | ICD-10-CM

## 2019-11-05 DIAGNOSIS — D1801 Hemangioma of skin and subcutaneous tissue: Secondary | ICD-10-CM

## 2019-11-05 DIAGNOSIS — L820 Inflamed seborrheic keratosis: Secondary | ICD-10-CM

## 2019-11-05 MED ORDER — FLUCONAZOLE 200 MG PO TABS
200.0000 mg | ORAL_TABLET | Freq: Every day | ORAL | 0 refills | Status: DC
Start: 2019-11-05 — End: 2020-07-06

## 2019-11-05 MED ORDER — TAVABOROLE 5 % EX SOLN: CUTANEOUS | 2 refills | Status: DC

## 2019-11-05 NOTE — Patient Instructions (Addendum)
Recommend daily broad spectrum sunscreen SPF 30+ to sun-exposed areas, reapply every 2 hours as needed. Call for new or changing lesions.  Prior to procedure, discussed risks of blister formation, small wound, skin dyspigmentation, or rare scar following cryotherapy.   Cryotherapy Aftercare  . Wash gently with soap and water everyday.   Marland Kitchen Apply Vaseline and Band-Aid daily until healed.   Take Fluconazole 200mg  by mouth once weekly

## 2019-11-05 NOTE — Progress Notes (Signed)
Follow-Up Visit   Subjective  Dustin [name] is a 49 y.o. male who presents for the following: TBSE (has a spot on his back, has been there for about 3 to 4 months, also has a tan spot on left groin asymptomatic- present for years, no changes. Patient also states that he was seeing Podiatry in [place] for toe nail fungus that they were treating with lasers and oral pulsed Lamisil. no improvement noted.  Nails have worsened).  Also got a rash when taking Lamisil daily for several months years ago when treated by Dr. Raliegh Ip.   The following portions of the chart were reviewed this encounter and updated as appropriate:      Review of Systems:  No other skin or systemic complaints except as noted in HPI or Assessment and Plan.  Objective  Well appearing patient in no apparent distress; mood and affect are within normal limits.  A full examination was performed including scalp, head, eyes, ears, nose, lips, neck, chest, axillae, abdomen, back, buttocks, bilateral upper extremities, bilateral lower extremities, hands, feet, fingers, toes, fingernails, and toenails. All findings within normal limits unless otherwise noted below.  Objective  Right Upper Back: Keratotic waxy tan stuck-on papule  Objective  Left Occipital Scalp  at hairline: Flesh colored papule  Objective  Left 3rd Toe Nail, Left 4th Toe Nail, Left 5th Toe Nail:  Yellow/white nail discoloration with thickening/crumbling of the nailplate R foot toenails are clear, fingernails are clear  Left 2nd Toe Nail: 100% involved 2nd toe nail to the 5th toe nail  Left Great Toe Nail: 30% involved   Images    Objective  Left Upper Shaft Penis: Stuck-on, waxy, tan-brown papule-- Discussed benign etiology and prognosis.    Assessment & Plan  Inflamed seborrheic keratosis Right Upper Back  Cryotherapy today Prior to procedure, discussed risks of blister formation, small wound, skin dyspigmentation, or rare scar following  cryotherapy.    Destruction of lesion - Right Upper Back  Destruction method: cryotherapy   Informed consent: discussed and consent obtained   Lesion destroyed using liquid nitrogen: Yes   Region frozen until ice ball extended beyond lesion: Yes   Outcome: patient tolerated procedure well with no complications   Post-procedure details: wound care instructions given    Nevus Left Occipital Scalp  at hairline  Benign-appearing.  Observation.  Call clinic for new or changing moles.  Recommend daily use of broad spectrum spf 30+ sunscreen to sun-exposed areas.    Onychomycosis (5) Left Great Toe Nail; Left 2nd Toe Nail; Left 3rd Toe Nail; Left 4th Toe Nail; Left 5th Toe Nail  Extensive involvement, patient has failed Lamisil PO Start Fluconazole 200 mg p.o. once weekly #30 0RF Start Kerydin 5% solution, apply once daily to all toenails on left foot Discussed clearance will be slow and may not be complete.  Can take a year for toenails to grow out. Discussed rare risk liver inflammation with oral fluconazole causing N/V/abd pain/skin yellowing. Pt denies any h/o liver disease, does not take cholesterol lowering meds.  Will clinically monitor for side effects.  fluconazole (DIFLUCAN) 200 MG tablet - Left 2nd Toe Nail, Left 3rd Toe Nail, Left 4th Toe Nail, Left 5th Toe Nail, Left Great Toe Nail  Tavaborole (KERYDIN) 5 % SOLN - Left 2nd Toe Nail, Left 3rd Toe Nail, Left 4th Toe Nail, Left 5th Toe Nail, Left Great Toe Nail  Seborrheic keratosis Left Upper Shaft Penis  Reassured benign age-related growth.  Recommend observation.  Discussed cryotherapy if spot(s) become irritated or inflamed. Patient defers at this time    Lentigines - Scattered tan macules - Discussed due to sun exposure - Benign, observe - Call for any changes  Seborrheic Keratoses - Stuck-on, waxy, tan-brown papules and plaques  - Discussed benign etiology and prognosis. - Observe - Call for any  changes  Melanocytic Nevi - Tan-brown and/or pink-flesh-colored symmetric macules and papules - Benign appearing on exam today - Observation - Call clinic for new or changing moles - Recommend daily use of broad spectrum spf 30+ sunscreen to sun-exposed areas.   Hemangiomas - Red papules - Discussed benign nature - Observe - Call for any changes  Actinic Damage - diffuse scaly erythematous macules with underlying dyspigmentation - Recommend daily broad spectrum sunscreen SPF 30+ to sun-exposed areas, reapply every 2 hours as needed.  - Call for new or changing lesions.  Skin cancer screening performed today.   Return in about 3 months (around 02/05/2020) for Onychomycosis.  Marene Lenz, CMA, am acting as scribe for Brendolyn Patty, MD .  Documentation: I have reviewed the above documentation for accuracy and completeness, and I agree with the above.  Brendolyn Patty MD

## 2019-11-14 ENCOUNTER — Telehealth: Payer: Self-pay

## 2019-11-14 NOTE — Telephone Encounter (Signed)
Patients insurance has denied coverage for Ralston. Send in Ciclopirox?

## 2019-11-15 NOTE — Telephone Encounter (Signed)
May try Jublia. If not covered, send Ciclopirox laquer.

## 2019-11-18 ENCOUNTER — Ambulatory Visit (INDEPENDENT_AMBULATORY_CARE_PROVIDER_SITE_OTHER): Payer: 59 | Admitting: Family Medicine

## 2019-11-18 ENCOUNTER — Other Ambulatory Visit: Payer: Self-pay

## 2019-11-18 ENCOUNTER — Encounter: Payer: Self-pay | Admitting: Family Medicine

## 2019-11-18 VITALS — BP 132/88 | HR 79 | Temp 98.4°F | Ht 77.0 in | Wt 233.0 lb

## 2019-11-18 DIAGNOSIS — E291 Testicular hypofunction: Secondary | ICD-10-CM

## 2019-11-18 DIAGNOSIS — I10 Essential (primary) hypertension: Secondary | ICD-10-CM

## 2019-11-18 DIAGNOSIS — Z136 Encounter for screening for cardiovascular disorders: Secondary | ICD-10-CM | POA: Diagnosis not present

## 2019-11-18 DIAGNOSIS — Z0000 Encounter for general adult medical examination without abnormal findings: Secondary | ICD-10-CM

## 2019-11-18 DIAGNOSIS — M25521 Pain in right elbow: Secondary | ICD-10-CM | POA: Diagnosis not present

## 2019-11-18 LAB — UA/M W/RFLX CULTURE, ROUTINE
Bilirubin, UA: NEGATIVE
Glucose, UA: NEGATIVE
Ketones, UA: NEGATIVE
Leukocytes,UA: NEGATIVE
Nitrite, UA: NEGATIVE
Protein,UA: NEGATIVE
RBC, UA: NEGATIVE
Specific Gravity, UA: 1.025 (ref 1.005–1.030)
Urobilinogen, Ur: 0.2 mg/dL (ref 0.2–1.0)
pH, UA: 5.5 (ref 5.0–7.5)

## 2019-11-18 MED ORDER — CICLOPIROX OLAMINE 0.77 % EX SUSP
1.0000 | Freq: Every evening | CUTANEOUS | 2 refills | Status: DC
Start: 2019-11-18 — End: 2019-11-19

## 2019-11-18 NOTE — Patient Instructions (Signed)
[facility] for the x-ray

## 2019-11-18 NOTE — Assessment & Plan Note (Signed)
BPs stable and WNL, continue current regimen 

## 2019-11-18 NOTE — Telephone Encounter (Signed)
Prescription sent in and patient advised of medication change. °
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
## 2019-11-18 NOTE — Assessment & Plan Note (Signed)
Recheck testosterone, continue current regimen with close monitoring

## 2019-11-19 ENCOUNTER — Other Ambulatory Visit: Payer: Self-pay

## 2019-11-19 MED ORDER — CICLOPIROX 8 % EX SOLN: Freq: Every day | CUTANEOUS | 2 refills | Status: DC

## 2019-11-24 ENCOUNTER — Encounter: Payer: Self-pay | Admitting: Family Medicine

## 2019-11-24 LAB — TSH: TSH: 2.02 u[IU]/mL (ref 0.450–4.500)

## 2019-11-24 LAB — CBC WITH DIFFERENTIAL/PLATELET
Basophils Absolute: 0 10*3/uL (ref 0.0–0.2)
Basos: 0 %
EOS (ABSOLUTE): 0.4 10*3/uL (ref 0.0–0.4)
Eos: 6 %
Hematocrit: 45.6 % (ref 37.5–51.0)
Hemoglobin: 15.4 g/dL (ref 13.0–17.7)
Immature Grans (Abs): 0 10*3/uL (ref 0.0–0.1)
Immature Granulocytes: 0 %
Lymphocytes Absolute: 1.2 10*3/uL (ref 0.7–3.1)
Lymphs: 19 %
MCH: 29.5 pg (ref 26.6–33.0)
MCHC: 33.8 g/dL (ref 31.5–35.7)
MCV: 87 fL (ref 79–97)
Monocytes Absolute: 0.7 10*3/uL (ref 0.1–0.9)
Monocytes: 10 %
Neutrophils Absolute: 4.3 10*3/uL (ref 1.4–7.0)
Neutrophils: 65 %
Platelets: 237 10*3/uL (ref 150–450)
RBC: 5.22 x10E6/uL (ref 4.14–5.80)
RDW: 13.7 % (ref 11.6–15.4)
WBC: 6.6 10*3/uL (ref 3.4–10.8)

## 2019-11-24 LAB — COMPREHENSIVE METABOLIC PANEL
ALT: 29 IU/L (ref 0–44)
AST: 69 IU/L — ABNORMAL HIGH (ref 0–40)
Albumin/Globulin Ratio: 1.4 (ref 1.2–2.2)
Albumin: 4.4 g/dL (ref 4.0–5.0)
Alkaline Phosphatase: 43 IU/L — ABNORMAL LOW (ref 48–121)
BUN/Creatinine Ratio: 14 (ref 9–20)
BUN: 18 mg/dL (ref 6–24)
Bilirubin Total: 0.5 mg/dL (ref 0.0–1.2)
CO2: 22 mmol/L (ref 20–29)
Calcium: 9.9 mg/dL (ref 8.7–10.2)
Chloride: 99 mmol/L (ref 96–106)
Creatinine, Ser: 1.25 mg/dL (ref 0.76–1.27)
GFR calc Af Amer: 79 mL/min/{1.73_m2} (ref >59)
GFR calc non Af Amer: 68 mL/min/{1.73_m2} (ref >59)
Globulin, Total: 3.2 g/dL (ref 1.5–4.5)
Glucose: 81 mg/dL (ref 65–99)
Potassium: 4.3 mmol/L (ref 3.5–5.2)
Sodium: 139 mmol/L (ref 134–144)
Total Protein: 7.6 g/dL (ref 6.0–8.5)

## 2019-11-24 LAB — TESTOSTERONE, FREE, TOTAL, SHBG
Sex Hormone Binding: 24.3 nmol/L (ref 16.5–55.9)
Testosterone, Free: 27 pg/mL — ABNORMAL HIGH (ref 6.8–21.5)
Testosterone: 833 ng/dL (ref 264–916)

## 2019-11-24 LAB — LIPID PANEL W/O CHOL/HDL RATIO
Cholesterol, Total: 190 mg/dL (ref 100–199)
HDL: 56 mg/dL (ref >39)
LDL Chol Calc (NIH): 117 mg/dL — ABNORMAL HIGH (ref 0–99)
Triglycerides: 94 mg/dL (ref 0–149)
VLDL Cholesterol Cal: 17 mg/dL (ref 5–40)

## 2019-12-06 ENCOUNTER — Telehealth: Payer: Self-pay | Admitting: Family Medicine

## 2019-12-06 NOTE — Telephone Encounter (Signed)
Patient notified that the order is in and that he can have the xray done when he would like, informed him of where he could have imaging done at.

## 2019-12-06 NOTE — Telephone Encounter (Signed)
Routing to provider  

## 2019-12-06 NOTE — Telephone Encounter (Signed)
Copied from [place] [id number]. Topic: General - Other >> Dec 06, 2019  9:02 AM Celene Kras wrote: Reason for CRM: Clarene Critchley, calling on behalf of pt, called and is requesting to have orders placed to have his right elbow x rayed. Please advise.

## 2019-12-06 NOTE — Telephone Encounter (Signed)
I do not see this name on his DPR so please return call directly to patient to remind him that we placed this order last time he was in and he was told he can walk in to the imaging facility to get it at his convenience

## 2020-01-02 ENCOUNTER — Ambulatory Visit
Admission: RE | Admit: 2020-01-02 | Discharge: 2020-01-02 | Disposition: A | Discharge status: Home / Self Care | Payer: Managed Care, Other (non HMO) | Source: Ambulatory Visit | Attending: Family Medicine | Admitting: Family Medicine

## 2020-01-02 ENCOUNTER — Ambulatory Visit
Admission: RE | Admit: 2020-01-02 | Discharge: 2020-01-02 | Disposition: A | Discharge status: Home / Self Care | Payer: Managed Care, Other (non HMO) | Attending: Family Medicine | Admitting: Family Medicine

## 2020-01-02 ENCOUNTER — Other Ambulatory Visit: Payer: Self-pay

## 2020-01-02 DIAGNOSIS — M25529 Pain in unspecified elbow: Secondary | ICD-10-CM | POA: Diagnosis present

## 2020-01-02 DIAGNOSIS — M25521 Pain in right elbow: Secondary | ICD-10-CM | POA: Diagnosis not present

## 2020-01-21 ENCOUNTER — Encounter: Payer: Self-pay | Admitting: Family Medicine

## 2020-02-05 ENCOUNTER — Other Ambulatory Visit: Payer: Self-pay | Admitting: Sports Medicine

## 2020-02-05 DIAGNOSIS — S59901A Unspecified injury of right elbow, initial encounter: Secondary | ICD-10-CM

## 2020-02-06 ENCOUNTER — Ambulatory Visit: Admission: RE | Admit: 2020-02-06 | Payer: Managed Care, Other (non HMO) | Source: Ambulatory Visit

## 2020-03-05 ENCOUNTER — Other Ambulatory Visit: Payer: Self-pay | Admitting: Cardiovascular Disease

## 2020-03-05 ENCOUNTER — Telehealth: Payer: Self-pay | Admitting: Nurse Practitioner

## 2020-03-05 NOTE — Telephone Encounter (Signed)
Patient requesting amLODipine (NORVASC) 5 MG tablet refill, patient states specialist declined refill until patient is seen. Chart reflects refill was sent advised patient to follow up with pharmacy but would still send request back to PCP.   [id number] [address], [place] Phone:  [phone]  Fax:  [phone]

## 2020-03-05 NOTE — Telephone Encounter (Signed)
Noted that Amlodipine refill was handled by Dr. Donivan Scull office today.  Phone call to pt.  Left vm that the refill was taken care of by Dr. Donivan Scull office.  Advised to return call to the office, if any questions.

## 2020-03-05 NOTE — Telephone Encounter (Signed)
Please schedule appointment for refills. Thank you! 

## 2020-03-05 NOTE — Telephone Encounter (Signed)
Patient does not wish to schedule States he will get refill from PCP

## 2020-03-09 ENCOUNTER — Other Ambulatory Visit: Payer: Self-pay

## 2020-03-09 MED ORDER — FLUTICASONE PROPIONATE 50 MCG/ACT NA SUSP: 1.0000 | Freq: Every day | NASAL | 4 refills | Status: DC

## 2020-03-09 NOTE — Telephone Encounter (Signed)
Last seen 11/18/19 by Apolonio Schneiders, has appointment 05/20/20

## 2020-03-17 ENCOUNTER — Ambulatory Visit: Payer: Managed Care, Other (non HMO) | Admitting: Dermatology

## 2020-05-11 ENCOUNTER — Other Ambulatory Visit: Payer: Self-pay | Admitting: Nurse Practitioner

## 2020-05-11 MED ORDER — TESTOSTERONE ENANTHATE 200 MG/ML IM SOLN: INTRAMUSCULAR | 1 refills | Status: DC

## 2020-05-11 NOTE — Telephone Encounter (Signed)
Routing to provider  

## 2020-05-11 NOTE — Telephone Encounter (Signed)
Medication Refill - Medication: Testerone   Has the patient contacted their pharmacy? Yes.   (Agent: If no, request that the patient contact the pharmacy for the refill.) (Agent: If yes, when and what did the pharmacy advise?)  Preferred Pharmacy (with phone number or street name):  [id number] [place] [address]  [address] [place] [address]  Phone: [phone] Fax: [phone]  Hours: Not open 24 hours     Agent: Please be advised that RX refills may take up to 3 business days. We ask that you follow-up with your pharmacy.

## 2020-05-11 NOTE — Telephone Encounter (Signed)
Requested medication (s) are due for refill today: Yes  Requested medication (s) are on the active medication list: Yes  Last refill:  10/25/19  Future visit scheduled: Yes  Notes to clinic:  Unable to refill per protocol, no assigned     Requested Prescriptions  Pending Prescriptions Disp Refills   testosterone enanthate (DELATESTRYL) 200 MG/ML injection 5 mL 1    Sig: For IM use only      Off-Protocol Failed - 05/11/2020  3:56 PM      Failed - Medication not assigned to a protocol, review manually.      Passed - Valid encounter within last 12 months    Recent Outpatient Visits           5 months ago Male hypogonadism   [facility] Mer[place] Roof [place], [place]   1 year ago Acute sinusitis, recurrence not specified, unspecified location   [facility] [place], Jeannette How, [place]   1 year ago PE (physical exam), annual   [facility] [place], Jeannette How, [place]   1 year ago Essential hypertension   [place], Jeannette How, MD   2 years ago PE (physical exam), annual   [place], [place]       Future Appointments             In 1 week Venita Lick, NP [employer], [place]   In 1 month Brendolyn Patty, MD [place]

## 2020-05-20 ENCOUNTER — Other Ambulatory Visit: Payer: Self-pay

## 2020-05-20 ENCOUNTER — Ambulatory Visit: Payer: 59 | Admitting: Nurse Practitioner

## 2020-05-20 ENCOUNTER — Encounter: Payer: Self-pay | Admitting: Nurse Practitioner

## 2020-05-20 ENCOUNTER — Ambulatory Visit: Payer: 59 | Admitting: Family Medicine

## 2020-05-20 VITALS — BP 128/79 | HR 67 | Temp 98.4°F | Ht 77.72 in | Wt 229.8 lb

## 2020-05-20 DIAGNOSIS — I10 Essential (primary) hypertension: Secondary | ICD-10-CM | POA: Diagnosis not present

## 2020-05-20 DIAGNOSIS — Z125 Encounter for screening for malignant neoplasm of prostate: Secondary | ICD-10-CM | POA: Diagnosis not present

## 2020-05-20 DIAGNOSIS — M25521 Pain in right elbow: Secondary | ICD-10-CM | POA: Insufficient documentation

## 2020-05-20 HISTORY — DX: Pain in right elbow: M25.521

## 2020-05-20 NOTE — Patient Instructions (Signed)
DASH Eating Plan DASH stands for "Dietary Approaches to Stop Hypertension." The DASH eating plan is a healthy eating plan that has been shown to reduce high blood pressure (hypertension). It may also reduce your risk for type 2 diabetes, heart disease, and stroke. The DASH eating plan may also help with weight loss. What are tips for following this plan?  General guidelines  Avoid eating more than 2,300 mg (milligrams) of salt (sodium) a day. If you have hypertension, you may need to reduce your sodium intake to 1,500 mg a day.  Limit alcohol intake to no more than 1 drink a day for nonpregnant women and 2 drinks a day for men. One drink equals 12 oz of beer, 5 oz of wine, or 1 oz of hard liquor.  Work with your health care provider to maintain a healthy body weight or to lose weight. Ask what an ideal weight is for you.  Get at least 30 minutes of exercise that causes your heart to beat faster (aerobic exercise) most days of the week. Activities may include walking, swimming, or biking.  Work with your health care provider or diet and nutrition specialist (dietitian) to adjust your eating plan to your individual calorie needs. Reading food labels   Check food labels for the amount of sodium per serving. Choose foods with less than 5 percent of the Daily Value of sodium. Generally, foods with less than 300 mg of sodium per serving fit into this eating plan.  To find whole grains, look for the word "whole" as the first word in the ingredient list. Shopping  Buy products labeled as "low-sodium" or "no salt added."  Buy fresh foods. Avoid canned foods and premade or frozen meals. Cooking  Avoid adding salt when cooking. Use salt-free seasonings or herbs instead of table salt or sea salt. Check with your health care provider or pharmacist before using salt substitutes.  Do not fry foods. Cook foods using healthy methods such as baking, boiling, grilling, and broiling instead.  Cook with  heart-healthy oils, such as olive, canola, soybean, or sunflower oil. Meal planning  Eat a balanced diet that includes: ? 5 or more servings of fruits and vegetables each day. At each meal, try to fill half of your plate with fruits and vegetables. ? Up to 6-8 servings of whole grains each day. ? Less than 6 oz of lean meat, poultry, or fish each day. A 3-oz serving of meat is about the same size as a deck of cards. One egg equals 1 oz. ? 2 servings of low-fat dairy each day. ? A serving of nuts, seeds, or beans 5 times each week. ? Heart-healthy fats. Healthy fats called Omega-3 fatty acids are found in foods such as flaxseeds and coldwater fish, like sardines, salmon, and mackerel.  Limit how much you eat of the following: ? Canned or prepackaged foods. ? Food that is high in trans fat, such as fried foods. ? Food that is high in saturated fat, such as fatty meat. ? Sweets, desserts, sugary drinks, and other foods with added sugar. ? Full-fat dairy products.  Do not salt foods before eating.  Try to eat at least 2 vegetarian meals each week.  Eat more home-cooked food and less restaurant, buffet, and fast food.  When eating at a restaurant, ask that your food be prepared with less salt or no salt, if possible. What foods are recommended? The items listed may not be a complete list. Talk with your dietitian about   what dietary choices are best for you. Grains Whole-grain or whole-wheat bread. Whole-grain or whole-wheat pasta. Brown rice. Oatmeal. Quinoa. Bulgur. Whole-grain and low-sodium cereals. Pita bread. Low-fat, low-sodium crackers. Whole-wheat flour tortillas. Vegetables Fresh or frozen vegetables (raw, steamed, roasted, or grilled). Low-sodium or reduced-sodium tomato and vegetable juice. Low-sodium or reduced-sodium tomato sauce and tomato paste. Low-sodium or reduced-sodium canned vegetables. Fruits All fresh, dried, or frozen fruit. Canned fruit in natural juice (without  added sugar). Meat and other protein foods Skinless chicken or turkey. Ground chicken or turkey. Pork with fat trimmed off. Fish and seafood. Egg whites. Dried beans, peas, or lentils. Unsalted nuts, nut butters, and seeds. Unsalted canned beans. Lean cuts of beef with fat trimmed off. Low-sodium, lean deli meat. Dairy Low-fat (1%) or fat-free (skim) milk. Fat-free, low-fat, or reduced-fat cheeses. Nonfat, low-sodium ricotta or cottage cheese. Low-fat or nonfat yogurt. Low-fat, low-sodium cheese. Fats and oils Soft margarine without trans fats. Vegetable oil. Low-fat, reduced-fat, or light mayonnaise and salad dressings (reduced-sodium). Canola, safflower, olive, soybean, and sunflower oils. Avocado. Seasoning and other foods Herbs. Spices. Seasoning mixes without salt. Unsalted popcorn and pretzels. Fat-free sweets. What foods are not recommended? The items listed may not be a complete list. Talk with your dietitian about what dietary choices are best for you. Grains Baked goods made with fat, such as croissants, muffins, or some breads. Dry pasta or rice meal packs. Vegetables Creamed or fried vegetables. Vegetables in a cheese sauce. Regular canned vegetables (not low-sodium or reduced-sodium). Regular canned tomato sauce and paste (not low-sodium or reduced-sodium). Regular tomato and vegetable juice (not low-sodium or reduced-sodium). Pickles. Olives. Fruits Canned fruit in a light or heavy syrup. Fried fruit. Fruit in cream or butter sauce. Meat and other protein foods Fatty cuts of meat. Ribs. Fried meat. Bacon. Sausage. Bologna and other processed lunch meats. Salami. Fatback. Hotdogs. Bratwurst. Salted nuts and seeds. Canned beans with added salt. Canned or smoked fish. Whole eggs or egg yolks. Chicken or turkey with skin. Dairy Whole or 2% milk, cream, and half-and-half. Whole or full-fat cream cheese. Whole-fat or sweetened yogurt. Full-fat cheese. Nondairy creamers. Whipped toppings.  Processed cheese and cheese spreads. Fats and oils Butter. Stick margarine. Lard. Shortening. Ghee. Bacon fat. Tropical oils, such as coconut, palm kernel, or palm oil. Seasoning and other foods Salted popcorn and pretzels. Onion salt, garlic salt, seasoned salt, table salt, and sea salt. Worcestershire sauce. Tartar sauce. Barbecue sauce. Teriyaki sauce. Soy sauce, including reduced-sodium. Steak sauce. Canned and packaged gravies. Fish sauce. Oyster sauce. Cocktail sauce. Horseradish that you find on the shelf. Ketchup. Mustard. Meat flavorings and tenderizers. Bouillon cubes. Hot sauce and Tabasco sauce. Premade or packaged marinades. Premade or packaged taco seasonings. Relishes. Regular salad dressings. Where to find more information:  National Heart, Lung, and Blood Institute: www.nhlbi.nih.gov  American Heart Association: www.heart.org Summary  The DASH eating plan is a healthy eating plan that has been shown to reduce high blood pressure (hypertension). It may also reduce your risk for type 2 diabetes, heart disease, and stroke.  With the DASH eating plan, you should limit salt (sodium) intake to 2,300 mg a day. If you have hypertension, you may need to reduce your sodium intake to 1,500 mg a day.  When on the DASH eating plan, aim to eat more fresh fruits and vegetables, whole grains, lean proteins, low-fat dairy, and heart-healthy fats.  Work with your health care provider or diet and nutrition specialist (dietitian) to adjust your eating plan to your   individual calorie needs. This information is not intended to replace advice given to you by your health care provider. Make sure you discuss any questions you have with your health care provider. Document Revised: 05/12/2017 Document Reviewed: 05/23/2016 Elsevier Patient Education  2020 Elsevier Inc.  

## 2020-05-20 NOTE — Progress Notes (Signed)
BP 128/79   Pulse 67   Temp 98.4 F (36.9 C) (Oral)   Ht 6' 5.72" (1.974 m)   Wt 229 lb 12.8 oz (104.2 kg)   SpO2 98%   BMI 26.75 kg/m    Subjective:    Patient ID: Dustin [name], male    DOB: 1970-07-24, 49 y.o.   MRN: [id number]  HPI: Dustin [name] is a 49 y.o. male  Chief Complaint  Patient presents with  . Hypertension  . Elbow Pain    follow up on Right Elbow, patient states it no longer hurts it is just weak  . wants lab    Did not get a PSA at physical in June   HYPERTENSION Continues on Amlodipine daily. Hypertension status: stable  Satisfied with current treatment? yes Duration of hypertension: chronic BP monitoring frequency:  weekly BP range: this morning 135/78 BP medication side effects:  no Medication compliance: good compliance Aspirin: no Recurrent headaches: no Visual changes: no Palpitations: no Dyspnea: no Chest pain: no Lower extremity edema: no Dizzy/lightheaded: no   RIGHT ELBOW PAIN Reports this has improved since last visit, did see ortho.  States this improved with self rehab.  Starting to get strength back to elbow.   Swelling: no Redness: no  Warmth: no Trauma: no Chest pain: no  Shortness of breath: no  Fever: no Decreased sensation: no Paresthesias: no Weakness: mild  Relevant past medical, surgical, family and social history reviewed and updated as indicated. Interim medical history since our last visit reviewed. Allergies and medications reviewed and updated.  Review of Systems  Constitutional: Negative for activity change, diaphoresis, fatigue and fever.  Respiratory: Negative for cough, chest tightness, shortness of breath and wheezing.   Cardiovascular: Negative for chest pain, palpitations and leg swelling.  Gastrointestinal: Negative.   Endocrine: Negative.   Musculoskeletal: Negative for arthralgias.  Neurological: Negative.   Psychiatric/Behavioral: Negative.     Per HPI unless specifically indicated above      Objective:    BP 128/79   Pulse 67   Temp 98.4 F (36.9 C) (Oral)   Ht 6' 5.72" (1.974 m)   Wt 229 lb 12.8 oz (104.2 kg)   SpO2 98%   BMI 26.75 kg/m   Wt Readings from Last 3 Encounters:  05/20/20 229 lb 12.8 oz (104.2 kg)  11/18/19 233 lb (105.7 kg)  02/20/19 223 lb (101.2 kg)    Physical Exam Vitals and nursing note reviewed.  Constitutional:      General: He is awake. He is not in acute distress.    Appearance: He is well-developed and well-groomed. He is not ill-appearing.  HENT:     Head: Normocephalic and atraumatic.     Right Ear: Hearing normal. No drainage.     Left Ear: Hearing normal. No drainage.  Eyes:     General: Lids are normal.        Right eye: No discharge.        Left eye: No discharge.     Conjunctiva/sclera: Conjunctivae normal.     Pupils: Pupils are equal, round, and reactive to light.  Neck:     Thyroid: No thyromegaly.     Vascular: No carotid bruit.     Trachea: Trachea normal.  Cardiovascular:     Rate and Rhythm: Normal rate and regular rhythm.     Heart sounds: Normal heart sounds, S1 normal and S2 normal. No murmur heard.  No gallop.   Pulmonary:  Effort: Pulmonary effort is normal. No accessory muscle usage or respiratory distress.     Breath sounds: Normal breath sounds.  Abdominal:     General: Bowel sounds are normal.     Palpations: Abdomen is soft.  Musculoskeletal:        General: Normal range of motion.     Cervical back: Normal range of motion and neck supple.     Right lower leg: No edema.     Left lower leg: No edema.  Skin:    General: Skin is warm and dry.     Capillary Refill: Capillary refill takes less than 2 seconds.     Findings: No rash.  Neurological:     Mental Status: He is alert and oriented to person, place, and time.  Psychiatric:        Attention and Perception: Attention normal.        Mood and Affect: Mood normal.        Speech: Speech normal.        Behavior: Behavior normal. Behavior is  cooperative.        Thought Content: Thought content normal.     Results for orders placed or performed in visit on 11/18/19  CBC with Differential/Platelet  Result Value Ref Range   WBC 6.6 3.4 - 10.8 x10E3/uL   RBC 5.22 4.14 - 5.80 x10E6/uL   Hemoglobin 15.4 13.0 - 17.7 g/dL   Hematocrit 45.6 37.5 - 51.0 %   MCV 87 79 - 97 fL   MCH 29.5 26.6 - 33.0 pg   MCHC 33.8 31 - 35 g/dL   RDW 13.7 11.6 - 15.4 %   Platelets 237 150 - 450 x10E3/uL   Neutrophils 65 Not Estab. %   Lymphs 19 Not Estab. %   Monocytes 10 Not Estab. %   Eos 6 Not Estab. %   Basos 0 Not Estab. %   Neutrophils Absolute 4.3 1.40 - 7.00 x10E3/uL   Lymphocytes Absolute 1.2 0 - 3 x10E3/uL   Monocytes Absolute 0.7 0 - 0 x10E3/uL   EOS (ABSOLUTE) 0.4 0.0 - 0.4 x10E3/uL   Basophils Absolute 0.0 0 - 0 x10E3/uL   Immature Granulocytes 0 Not Estab. %   Immature Grans (Abs) 0.0 0.0 - 0.1 x10E3/uL  Comprehensive metabolic panel  Result Value Ref Range   Glucose 81 65 - 99 mg/dL   BUN 18 6 - 24 mg/dL   Creatinine, Ser 1.25 0.76 - 1.27 mg/dL   GFR calc non Af Amer 68 >59 mL/min/1.73   GFR calc Af Amer 79 >59 mL/min/1.73   BUN/Creatinine Ratio 14 9 - 20   Sodium 139 134 - 144 mmol/L   Potassium 4.3 3.5 - 5.2 mmol/L   Chloride 99 96 - 106 mmol/L   CO2 22 20 - 29 mmol/L   Calcium 9.9 8.7 - 10.2 mg/dL   Total Protein 7.6 6.0 - 8.5 g/dL   Albumin 4.4 4.0 - 5.0 g/dL   Globulin, Total 3.2 1.5 - 4.5 g/dL   Albumin/Globulin Ratio 1.4 1.2 - 2.2   Bilirubin Total 0.5 0.0 - 1.2 mg/dL   Alkaline Phosphatase 43 (L) 48 - 121 IU/L   AST 69 (H) 0 - 40 IU/L   ALT 29 0 - 44 IU/L  Lipid Panel w/o Chol/HDL Ratio  Result Value Ref Range   Cholesterol, Total 190 100 - 199 mg/dL   Triglycerides 94 0 - 149 mg/dL   HDL 56 >39 mg/dL   VLDL Cholesterol Cal 17  5 - 40 mg/dL   LDL Chol Calc (NIH) 117 (H) 0 - 99 mg/dL  TSH  Result Value Ref Range   TSH 2.020 0.450 - 4.500 uIU/mL  UA/M w/rflx Culture, Routine   Specimen: Urine   URINE   Result Value Ref Range   Specific Gravity, UA 1.025 1.005 - 1.030   pH, UA 5.5 5.0 - 7.5   Color, UA Yellow Yellow   Appearance Ur Clear Clear   Leukocytes,UA Negative Negative   Protein,UA Negative Negative/Trace   Glucose, UA Negative Negative   Ketones, UA Negative Negative   RBC, UA Negative Negative   Bilirubin, UA Negative Negative   Urobilinogen, Ur 0.2 0.2 - 1.0 mg/dL   Nitrite, UA Negative Negative  Testosterone, Free, Total, SHBG  Result Value Ref Range   Testosterone 833 264 - 916 ng/dL   Testosterone, Free 27.0 (H) 6.8 - 21.5 pg/mL   Sex Hormone Binding 24.3 16.5 - 55.9 nmol/L      Assessment & Plan:   Problem List Items Addressed This Visit      Cardiovascular and Mediastinum   Hypertension - Primary    Chronic, stable with BP below goal in office and on home readings.  Continue current medication regimen and adjust as needed.  Refills sent as needed.  BMP today.  Recommend he continue to monitor BP weekly at home and document + focus on DASH diet.  Return in 6 months for physical.      Relevant Orders   Basic metabolic panel     Other   Right elbow pain    Acute and improved at this time.  No further intervention, return to office if symptoms return.       Other Visit Diagnoses    Prostate cancer screening       Takes testosterone, check PSA today.   Relevant Orders   PSA       Follow up plan: Return in about 6 months (around 11/18/2020) for Annual physical and meet new PCP.

## 2020-05-20 NOTE — Assessment & Plan Note (Signed)
Chronic, stable with BP below goal in office and on home readings.  Continue current medication regimen and adjust as needed.  Refills sent as needed.  BMP today.  Recommend he continue to monitor BP weekly at home and document + focus on DASH diet.  Return in 6 months for physical.

## 2020-05-20 NOTE — Assessment & Plan Note (Signed)
Acute and improved at this time.  No further intervention, return to office if symptoms return.

## 2020-05-21 LAB — BASIC METABOLIC PANEL
BUN/Creatinine Ratio: 16 (ref 9–20)
BUN: 23 mg/dL (ref 6–24)
CO2: 25 mmol/L (ref 20–29)
Calcium: 9.7 mg/dL (ref 8.7–10.2)
Chloride: 102 mmol/L (ref 96–106)
Creatinine, Ser: 1.47 mg/dL — ABNORMAL HIGH (ref 0.76–1.27)
GFR calc Af Amer: 65 mL/min/{1.73_m2} (ref >59)
GFR calc non Af Amer: 56 mL/min/{1.73_m2} — ABNORMAL LOW (ref >59)
Glucose: 67 mg/dL (ref 65–99)
Potassium: 4.3 mmol/L (ref 3.5–5.2)
Sodium: 139 mmol/L (ref 134–144)

## 2020-05-21 LAB — PSA: Prostate Specific Ag, Serum: 0.4 ng/mL (ref 0.0–4.0)

## 2020-05-21 NOTE — Progress Notes (Signed)
Contacted via MyChart   Good evening Dustin [name], your labs have returned.  PSA, prostate lab, remains normal.  Kidney function shows mild elevation in creatinine and reduction in GFR.  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  I recommend increase fluid intake and continue current medications.  Will recheck next visit.  Any questions? Keep being awesome!!  Thank you for allowing me to participate in your care. Kindest regards, Jennalyn Cawley

## 2020-07-06 ENCOUNTER — Ambulatory Visit: Payer: 59 | Admitting: Dermatology

## 2020-07-06 ENCOUNTER — Other Ambulatory Visit: Payer: Self-pay

## 2020-07-06 DIAGNOSIS — B351 Tinea unguium: Secondary | ICD-10-CM

## 2020-07-06 MED ORDER — FLUCONAZOLE 200 MG PO TABS: 200.0000 mg | ORAL_TABLET | Freq: Every day | ORAL | 0 refills | Status: DC

## 2020-07-06 MED ORDER — TAVABOROLE 5 % EX SOLN: 1.0000 "application " | Freq: Every day | CUTANEOUS | 4 refills | Status: DC

## 2020-07-06 NOTE — Progress Notes (Signed)
   Follow-Up Visit   Subjective  METE PURDUM is a 50 y.o. male who presents for the following: Nail Problem (Onychomycosis, Fluconazole 200mg  1 po q wk, finished 12/21, Ciclopirox qd). No side effects from treatment.  Toenails are doing better.   The following portions of the chart were reviewed this encounter and updated as appropriate:       Review of Systems:  No other skin or systemic complaints except as noted in HPI or Assessment and Plan.  Objective  Well appearing patient in no apparent distress; mood and affect are within normal limits.  A focused examination was performed including feet. Relevant physical exam findings are noted in the Assessment and Plan.  Objective  Left Foot/toenails: Distal yellow discoloration with onycholysis thickening of L 4th toenail- improved when compared to prior photos  Images     Assessment & Plan  Onychomycosis Left Foot/toenails  Improved when compared to baseline photos  Restart Fluconazole 200mg  1 po qwk for 2 more months Start Kerydin qhs to toenails D/C ciclopirox laquer  fluconazole (DIFLUCAN) 200 MG tablet - Left Foot/toenails  Tavaborole (KERYDIN) 5 % SOLN - Left Foot/toenails  Other Related Medications Tavaborole (KERYDIN) 5 % SOLN  Return if symptoms worsen or fail to improve.   Documentation: I have reviewed the above documentation for accuracy and completeness, and I agree with the above.  Brendolyn Patty MD

## 2020-08-06 ENCOUNTER — Other Ambulatory Visit: Payer: Self-pay | Admitting: Nurse Practitioner

## 2020-08-06 NOTE — Telephone Encounter (Signed)
Requested medication (s) are due for refill today: yes  Requested medication (s) are on the active medication list: yes  Last refill:  06/12/2020  Future visit scheduled:no  Notes to clinic: Medication not assigned to a protocol, review manually   Requested Prescriptions  Pending Prescriptions Disp Refills   testosterone enanthate (DELATESTRYL) 200 MG/ML injection [Pharmacy Med Name: TESTOSTERON ENAN 1,000 MG/5 ML] 5 mL 1    Sig: INJECT 200 MG AS DIRECTED EVERY 14 (FOURTEEN) DAYS.      Off-Protocol Failed - 08/06/2020  9:03 AM      Failed - Medication not assigned to a protocol, review manually.      Passed - Valid encounter within last 12 months    Recent Outpatient Visits           2 months ago Primary hypertension   [place], Barbaraann Faster, NP   8 months ago Male hypogonadism   [place], [place], [place]   1 year ago Acute sinusitis, recurrence not specified, unspecified location   [facility] [place], Jeannette How, MD   1 year ago PE (physical exam), annual   [facility] [place], Jeannette How, [place]   2 years ago Essential hypertension   [facility] [place], Jeannette How, MD

## 2020-08-12 NOTE — Telephone Encounter (Signed)
Hey [name],  Do you refill these? I usually send to urology please see encounter. Pt not been seen in 3 months. So no levels.

## 2020-08-12 NOTE — Telephone Encounter (Signed)
I do not fill these, have filled a couple times in past for Dr. Jeananne Rama patient as he prescribed, but had been trying to transition most to urology.  At [facility] we sent all of these to urology and I try to continue this due to the monitoring needed.  I would bring him in for visit before filling.

## 2020-09-07 ENCOUNTER — Other Ambulatory Visit: Payer: Self-pay | Admitting: Cardiovascular Disease

## 2020-09-07 NOTE — Telephone Encounter (Signed)
Scheduled 5/2 Rockey Situ

## 2020-09-07 NOTE — Telephone Encounter (Signed)
Rx request sent to pharmacy.  

## 2020-09-07 NOTE — Telephone Encounter (Signed)
Please call pt to schedule a follow-up appointment for refills. Thanks!

## 2020-10-13 ENCOUNTER — Ambulatory Visit: Payer: 59 | Admitting: Cardiovascular Disease

## 2020-10-20 IMAGING — CT CT HEART SCORING
2 series · 16 of 20 positions shown, 18 images · non-contrast
Comparison: None.
COMPARISON: None.

Addendum:
EXAM:
OVER-READ INTERPRETATION  CT CHEST

The following report is an over-read performed by radiologist Dr.
Negosava Nijaz [REDACTED] on 02/06/2019. This over-read
does not include interpretation of cardiac or coronary anatomy or
pathology. The coronary calcium score interpretation by the
cardiologist is attached.
CLINICAL DATA: Risk stratification
Coronary Calcium Score
TECHNIQUE: The patient was scanned on a Siemens Force scanner. Axial
non-contrast 3 mm slices were carried out through the heart. The
data set was analyzed on a dedicated work station and scored using
the Agatson method.

[Series 2: casc 3.0 i36f 2 bestdiast 65 % · axial · 0.39mm/px · z∈[-324,-192]mm · 8 of 58 slices shown, 10 images]
[im 7/58  vessel]
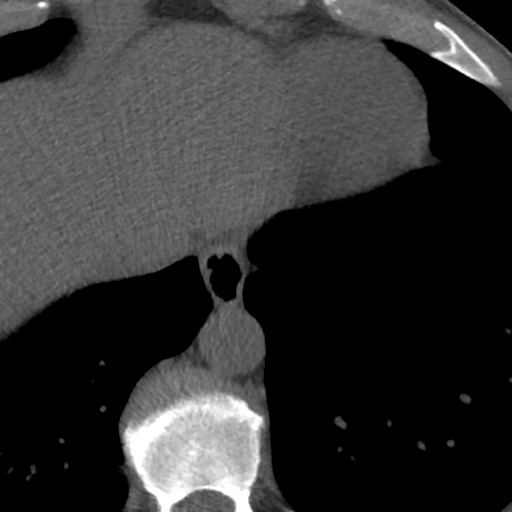
[im 7/58  lung]
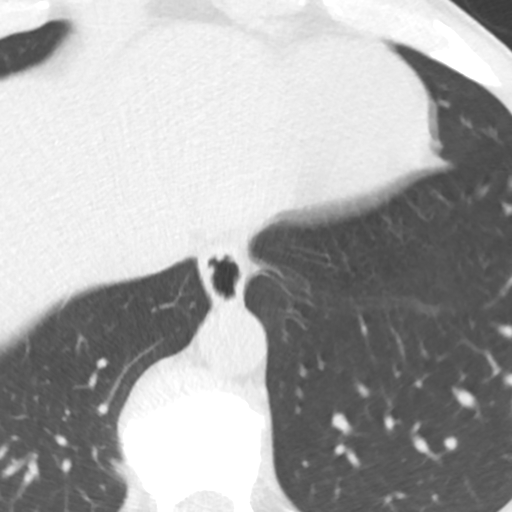
[im 13/58  vessel]
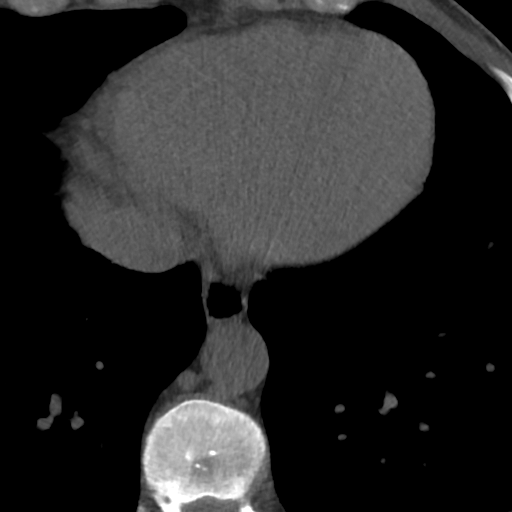
[im 20/58  vessel]
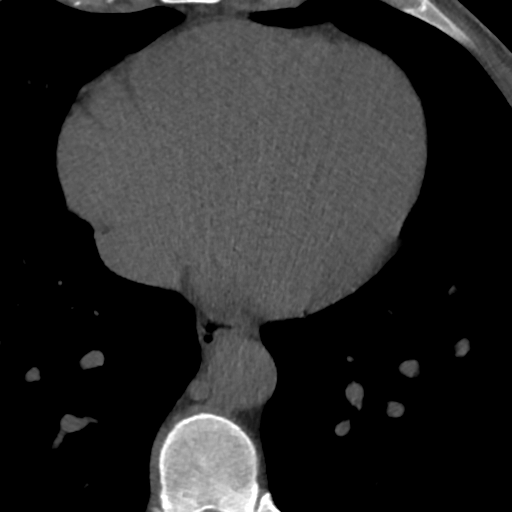
[im 26/58  vessel]
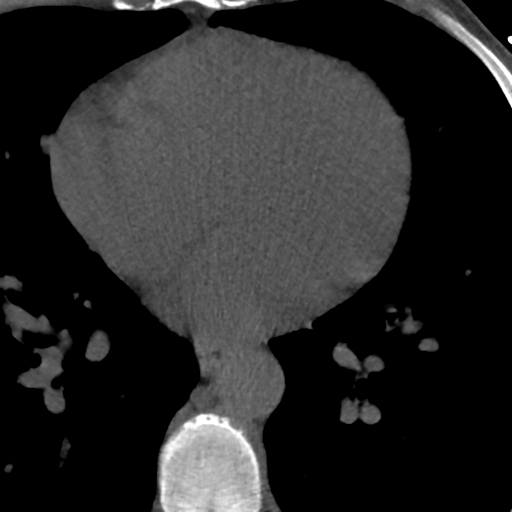
[im 32/58  vessel]
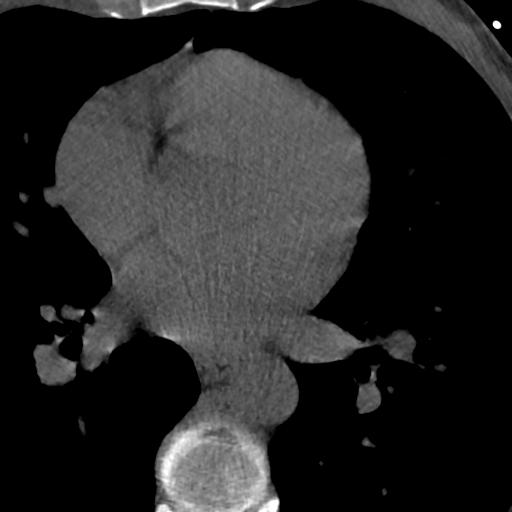
[im 32/58  lung]
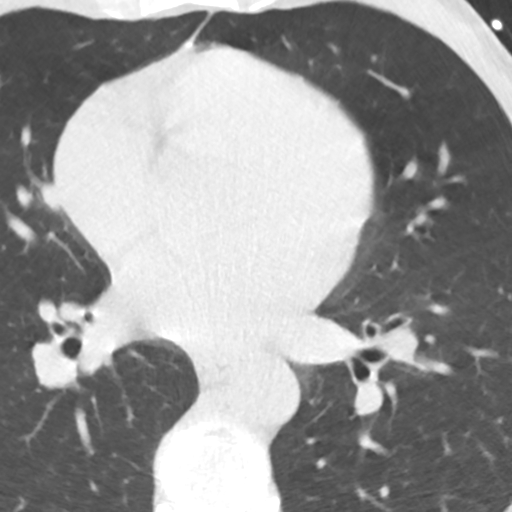
[im 39/58  vessel]
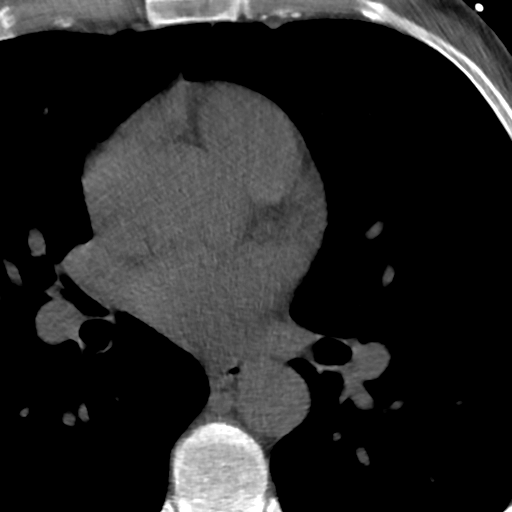
[im 45/58  vessel]
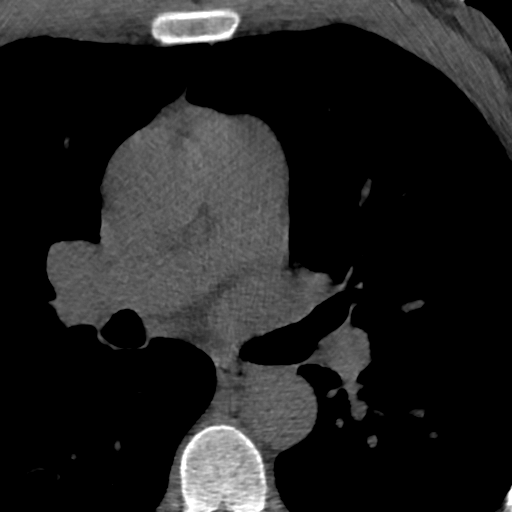
[im 51/58  vessel]
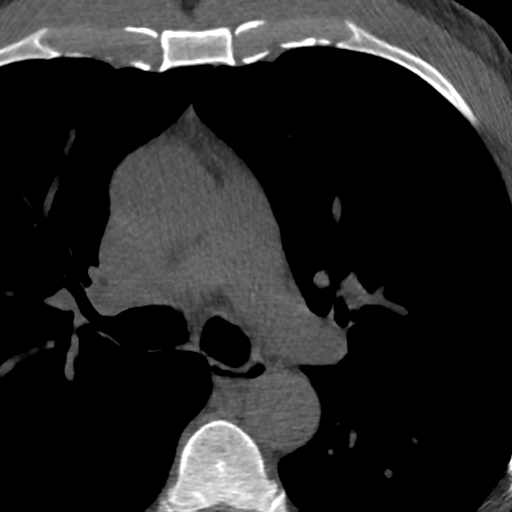

[Series 4: lung st 62 % · axial · 0.73mm/px · z∈[-324,-192]mm · 8 of 58 slices shown]
[im 7/58  lung]
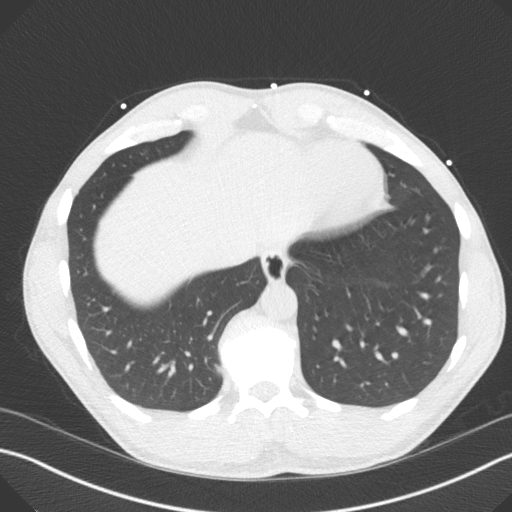
[im 13/58  lung]
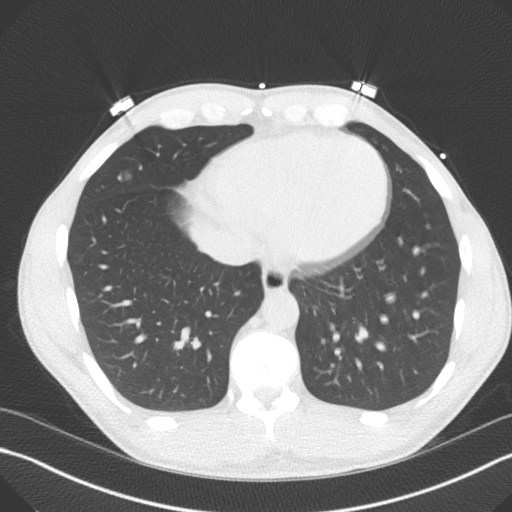
[im 20/58  lung]
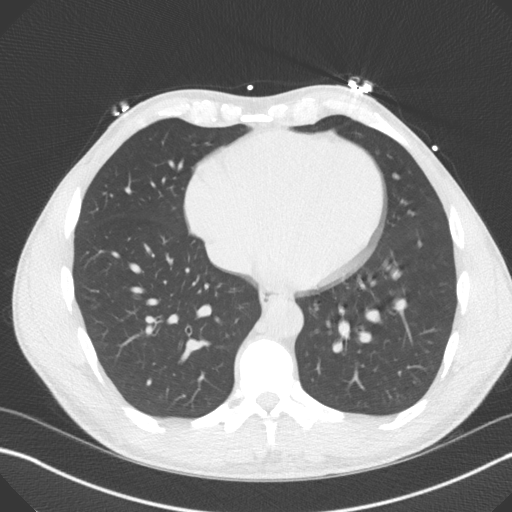
[im 26/58  lung]
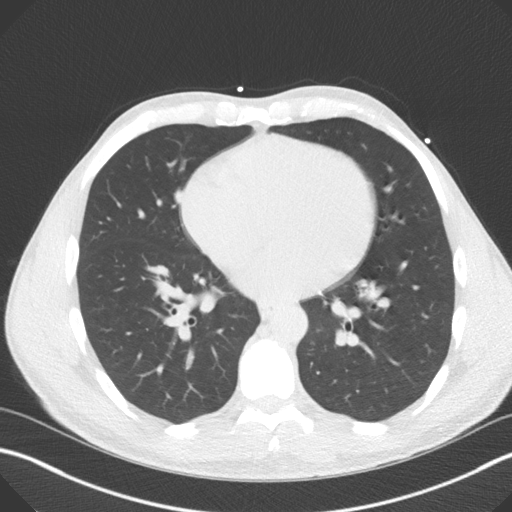
[im 32/58  lung]
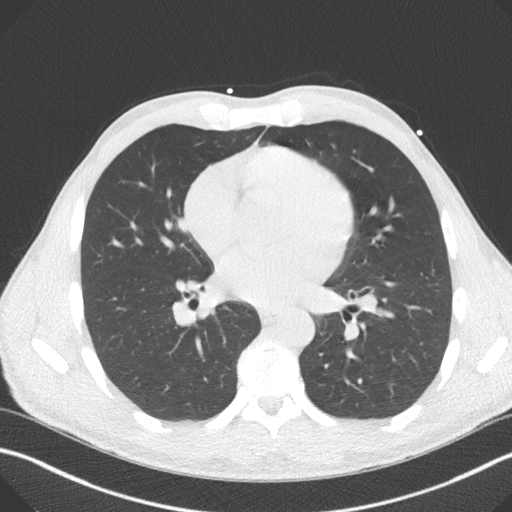
[im 39/58  lung]
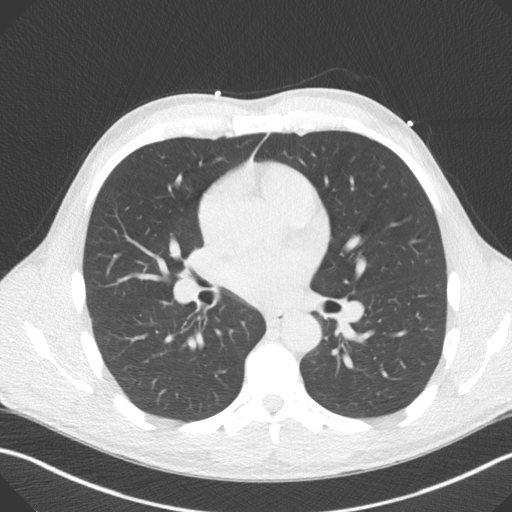
[im 45/58  lung]
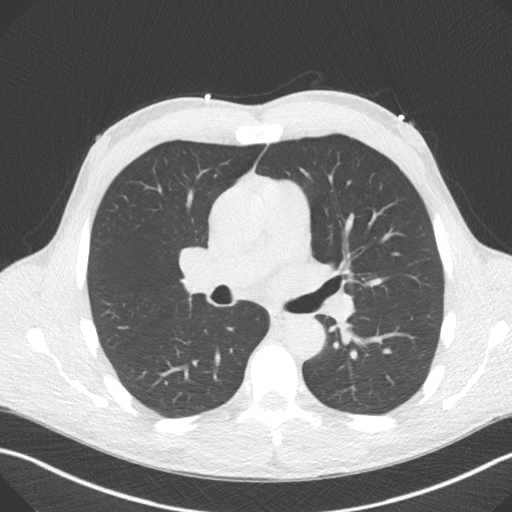
[im 51/58  lung]
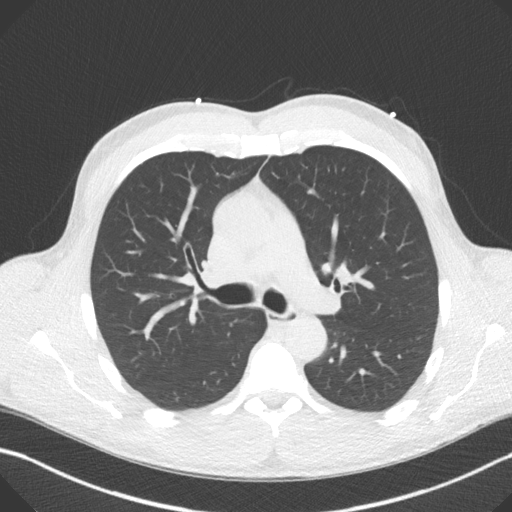

[16 of 20 positions shown; findings below may reference images not displayed]

FINDINGS: Vascular: Heart is normal size.  Visualized aorta normal caliber.

Mediastinum/Nodes: No adenopathy in the lower mediastinum or hila.

Lungs/Pleura: Visualized lungs clear.  No effusions.

Upper Abdomen: Imaging into the upper abdomen shows no acute
findings.

Musculoskeletal: Chest wall soft tissues are unremarkable. No acute
bony abnormality.
IMPRESSION: No acute or significant extracardiac abnormality.
FINDINGS: Non-cardiac: See separate report from [REDACTED].

Ascending Aorta: Normal caliber.

Pericardium: Normal.

Coronary arteries: Normal origins.
IMPRESSION: Coronary calcium score of 0.

*** End of Addendum ***
EXAM:
OVER-READ INTERPRETATION  CT CHEST

The following report is an over-read performed by radiologist Dr.
Negosava Nijaz [REDACTED] on 02/06/2019. This over-read
does not include interpretation of cardiac or coronary anatomy or
pathology. The coronary calcium score interpretation by the
cardiologist is attached.
FINDINGS: Vascular: Heart is normal size.  Visualized aorta normal caliber.

Mediastinum/Nodes: No adenopathy in the lower mediastinum or hila.

Lungs/Pleura: Visualized lungs clear.  No effusions.

Upper Abdomen: Imaging into the upper abdomen shows no acute
findings.

Musculoskeletal: Chest wall soft tissues are unremarkable. No acute
bony abnormality.
IMPRESSION: No acute or significant extracardiac abnormality.

## 2020-11-09 NOTE — Progress Notes (Signed)
Cardiology Office Note  Date:  11/10/2020   ID:  Dustin [name], DOB [birth year], MRN [id number]  PCP:  Charlynne Cousins, MD   Chief Complaint  Patient presents with  . 6 month follow up    "doing well." Medications reviewed by the patient verbally.     HPI:  Mr. Dustin [name] is a 50 year old gentleman with past medical history of Hypertension Hypogonadism Hyperlipidemia palpitations Calcium score of 0 February 06, 2019 Who presents for routine follow-up of his hypertension LOV 02/2019  In follow-up today reports his palpitations are well controlled Working out aggressively, feels good, weight down Continues to take amlodipine 5 mg daily Discussed prior lipids  Prior CT coronary calcium score reviewed Calcium score of 0 February 06, 2019  Echocardiogram reviewed with him showing normal LV function no significant valve disease  Previous change from amlodipine to Cardizem was not tolerated, felt washed out, changed back to amlodipine  EKG personally reviewed by myself on todays visit Shows normal sinus rhythm with rate 64 bpm no significant ST-T wave changes  Prior results reviewed Zio monitor   normal sinus rhythm Patient triggered events associated with APCs and bigeminal pattern, sometimes PVCs in a bigeminal pattern No other significant arrhythmia noted  Dad at 2, cardiac arrest Grandfather with CAD possibly in his 37s   PMH:   has a past medical history of Anemia, GERD (gastroesophageal reflux disease), Hypertension, and Male hypogonadism.  PSH:    Past Surgical History:  Procedure Laterality Date  . TONSILLECTOMY    . TYMPANOSTOMY TUBE PLACEMENT     t    Current Outpatient Medications  Medication Sig Dispense Refill  . amLODipine (NORVASC) 5 MG tablet Take 1 tablet (5 mg total) by mouth daily. Please keep your upcoming appointment for refills. 90 tablet 0  . B-D 3CC LUER-LOK SYR 18GX1-1/2 18G X 1-1/2" 3 ML MISC USE AS DIRECTED WITH TESTOSTERONE 12 each 0  .  ciclopirox (PENLAC) 8 % solution Apply topically at bedtime. 6.6 mL 2  . finasteride (PROPECIA) 1 MG tablet Take 1 mg by mouth daily.    . fluticasone (FLONASE) 50 MCG/ACT nasal spray Place 1 spray into both nostrils daily. 48 mL 4  . Tavaborole (KERYDIN) 5 % SOLN Apply 1 application topically daily. qhs to toenails 10 mL 4  . testosterone enanthate (DELATESTRYL) 200 MG/ML injection INJECT 200 MG AS DIRECTED EVERY 14 (FOURTEEN) DAYS. 5 mL 1   No current facility-administered medications for this visit.     Allergies:   Penicillins   Social History:  The patient  reports that he has never smoked. He has never used smokeless tobacco. He reports current alcohol use. He reports that he does not use drugs.   Family History:   family history includes Heart disease in his father and paternal grandfather; Thyroid disease in his mother.    Review of Systems: Review of Systems  Constitutional: Negative.   HENT: Negative.   Respiratory: Negative.   Cardiovascular: Negative.   Gastrointestinal: Negative.   Musculoskeletal: Negative.   Neurological: Negative.   Psychiatric/Behavioral: Negative.   All other systems reviewed and are negative.    PHYSICAL EXAM: VS:  BP 132/90 (BP Location: Left Arm, Patient Position: Sitting, Cuff Size: Normal)   Pulse 64   Ht 6\' 5"  (1.956 m)   Wt 228 lb (103.4 kg)   BMI 27.04 kg/m  , BMI Body mass index is 27.04 kg/m. GEN: Well nourished, well developed, in no acute distress HEENT: normal Neck:  no JVD, carotid bruits, or masses Cardiac: RRR; no murmurs, rubs, or gallops,no edema  Respiratory:  clear to auscultation bilaterally, normal work of breathing GI: soft, nontender, nondistended, + BS MS: no deformity or atrophy Skin: warm and dry, no rash Neuro:  Strength and sensation are intact Psych: euthymic mood, full affect   Recent Labs: 11/18/2019: ALT 29; Hemoglobin 15.4; Platelets 237; TSH 2.020 05/20/2020: BUN 23; Creatinine, Ser 1.47; Potassium  4.3; Sodium 139    Lipid Panel Lab Results  Component Value Date   CHOL 190 11/18/2019   HDL 56 11/18/2019   LDLCALC 117 (H) 11/18/2019   TRIG 94 11/18/2019      Wt Readings from Last 3 Encounters:  11/10/20 228 lb (103.4 kg)  05/20/20 229 lb 12.8 oz (104.2 kg)  11/18/19 233 lb (105.7 kg)     ASSESSMENT AND PLAN:  Problem List Items Addressed This Visit    Palpitations    Other Visit Diagnoses    Essential hypertension    -  Primary   Hyperlipidemia, unspecified hyperlipidemia type         Palpitations Symptoms improved, no further work-up needed at this time  Essential hypertension Blood pressure very reasonable, With weight down working out may be able to wean down or off the amlodipine Discussed a strategy of self checking at home  Hyperlipidemia Numbers discussed, should improve with recent working out, strict diet No medications needed Calcium score 0, low risk despite family history    Total encounter time more than 25 minutes  Greater than 50% was spent in counseling and coordination of care with the patient    Signed, Esmond Plants, M.D., Ph.D. [place], [address]

## 2020-11-10 ENCOUNTER — Encounter: Payer: Self-pay | Admitting: Cardiovascular Disease

## 2020-11-10 ENCOUNTER — Ambulatory Visit: Payer: 59 | Admitting: Cardiovascular Disease

## 2020-11-10 ENCOUNTER — Other Ambulatory Visit: Payer: Self-pay

## 2020-11-10 VITALS — BP 128/84 | HR 64 | Ht 77.0 in | Wt 228.0 lb

## 2020-11-10 DIAGNOSIS — E785 Hyperlipidemia, unspecified: Secondary | ICD-10-CM

## 2020-11-10 DIAGNOSIS — I10 Essential (primary) hypertension: Secondary | ICD-10-CM

## 2020-11-10 DIAGNOSIS — R002 Palpitations: Secondary | ICD-10-CM

## 2020-11-10 MED ORDER — AMLODIPINE BESYLATE 5 MG PO TABS: 5.0000 mg | ORAL_TABLET | Freq: Every day | ORAL | 3 refills | Status: DC

## 2020-11-10 NOTE — Patient Instructions (Signed)
Medication Instructions:  No changes  If you need a refill on your cardiac medications before your next appointment, please call your pharmacy.    Lab work: No new labs needed   If you have labs (blood work) drawn today and your tests are completely normal, you will receive your results only by: . MyChart Message (if you have MyChart) OR . A paper copy in the mail If you have any lab test that is abnormal or we need to change your treatment, we will call you to review the results.   Testing/Procedures: No new testing needed   Follow-Up: At [facility], you and your health needs are our priority.  As part of our continuing mission to provide you with exceptional heart care, we have created designated Provider Care Teams.  These Care Teams include your primary Cardiologist (physician) and Advanced Practice Providers (APPs -  Physician Assistants and Nurse Practitioners) who all work together to provide you with the care you need, when you need it.  . You will need a follow up appointment as needed  . Providers on your designated Care Team:   . Christopher Berge, NP . Ryan Dunn, PA-C . Jacquelyn Visser, PA-C  Any Other Special Instructions Will Be Listed Below (If Applicable).  COVID-19 Vaccine Information can be found at: [email or web address] For questions related to vaccine distribution or appointments, please email [email or web address] or call [phone].     

## 2020-12-13 ENCOUNTER — Other Ambulatory Visit: Payer: Self-pay | Admitting: Family Medicine

## 2020-12-13 NOTE — Telephone Encounter (Signed)
Requested medication (s) are due for refill today: yes  Requested medication (s) are on the active medication list: yes  Last refill:  05/11/20  Future visit scheduled: yes  Notes to clinic:  medication not assigned to  protocol   Requested Prescriptions  Pending Prescriptions Disp Refills   testosterone enanthate (DELATESTRYL) 200 MG/ML injection [Pharmacy Med Name: TESTOSTERON ENAN 1,000 MG/5 ML] 5 mL     Sig: INJECT 200 MG AS DIRECTED EVERY 14 (FOURTEEN) DAYS.      Off-Protocol Failed - 12/13/2020 10:06 AM      Failed - Medication not assigned to a protocol, review manually.      Passed - Valid encounter within last 12 months    Recent Outpatient Visits           6 months ago Primary hypertension   [place], Barbaraann Faster, NP   1 year ago Male hypogonadism   [place], [place], [place]   1 year ago Acute sinusitis, recurrence not specified, unspecified location   [facility] [place], Jeannette How, MD   1 year ago PE (physical exam), annual   [facility] [place], Jeannette How, MD   2 years ago Essential hypertension   [place], Jeannette How, MD       Future Appointments             In 4 days Vigg, Avanti, MD [facility]

## 2020-12-15 NOTE — Telephone Encounter (Signed)
Pt is scheduled 7/7

## 2020-12-16 NOTE — Telephone Encounter (Signed)
Will need to refer to urology for such, wont be able to refill here pl let him know thnx.

## 2020-12-17 ENCOUNTER — Other Ambulatory Visit: Payer: Self-pay

## 2020-12-17 ENCOUNTER — Ambulatory Visit (INDEPENDENT_AMBULATORY_CARE_PROVIDER_SITE_OTHER): Payer: 59 | Admitting: Internal Medicine

## 2020-12-17 ENCOUNTER — Encounter: Payer: Self-pay | Admitting: Internal Medicine

## 2020-12-17 VITALS — BP 146/76 | HR 67 | Temp 98.9°F | Ht 77.01 in | Wt 224.6 lb

## 2020-12-17 DIAGNOSIS — I10 Essential (primary) hypertension: Secondary | ICD-10-CM

## 2020-12-17 DIAGNOSIS — Z125 Encounter for screening for malignant neoplasm of prostate: Secondary | ICD-10-CM | POA: Diagnosis not present

## 2020-12-17 LAB — URINALYSIS, ROUTINE W REFLEX MICROSCOPIC
Bilirubin, UA: NEGATIVE
Glucose, UA: NEGATIVE
Leukocytes,UA: NEGATIVE
Nitrite, UA: NEGATIVE
Protein,UA: NEGATIVE
RBC, UA: NEGATIVE
Specific Gravity, UA: 1.02 (ref 1.005–1.030)
Urobilinogen, Ur: 0.2 mg/dL (ref 0.2–1.0)
pH, UA: 5.5 (ref 5.0–7.5)

## 2020-12-17 MED ORDER — TESTOSTERONE ENANTHATE 200 MG/ML IM SOLN: INTRAMUSCULAR | 1 refills | Status: DC

## 2020-12-17 NOTE — Progress Notes (Signed)
BP (!) 146/76   Pulse 67   Temp 98.9 F (37.2 C) (Oral)   Ht 6' 5.01" (1.956 m)   Wt 224 lb 9.6 oz (101.9 kg)   SpO2 98%   BMI 26.63 kg/m    Subjective:    Patient ID: Dustin [name], male    DOB: [birth year], 50 y.o.   MRN: [id number]  Chief Complaint  Patient presents with   Annual Exam    HPI: Dustin [name] is a 50 y.o. male  Pt is here for a physical, he has a ho Low T, Htn - weaning off of his Amlodipine, was seeing cards for palpitations which have now gotten better. Pt has stopped seeing cards since then,   Chief Complaint  Patient presents with   Annual Exam    Relevant past medical, surgical, family and social history reviewed and updated as indicated. Interim medical history since our last visit reviewed. Allergies and medications reviewed and updated.  Review of Systems  Per HPI unless specifically indicated above     Objective:    BP (!) 146/76   Pulse 67   Temp 98.9 F (37.2 C) (Oral)   Ht 6' 5.01" (1.956 m)   Wt 224 lb 9.6 oz (101.9 kg)   SpO2 98%   BMI 26.63 kg/m   Wt Readings from Last 3 Encounters:  12/17/20 224 lb 9.6 oz (101.9 kg)  11/10/20 228 lb (103.4 kg)  05/20/20 229 lb 12.8 oz (104.2 kg)    Physical Exam Vitals and nursing note reviewed.  Constitutional:      General: He is not in acute distress.    Appearance: Normal appearance. He is not ill-appearing or diaphoretic.  HENT:     Head: Normocephalic and atraumatic.     Right Ear: Tympanic membrane and external ear normal. There is no impacted cerumen.     Left Ear: External ear normal.     Nose: No congestion or rhinorrhea.     Mouth/Throat:     Pharynx: No oropharyngeal exudate or posterior oropharyngeal erythema.  Eyes:     Conjunctiva/sclera: Conjunctivae normal.     Pupils: Pupils are equal, round, and reactive to light.  Cardiovascular:     Rate and Rhythm: Normal rate and regular rhythm.     Heart sounds: No murmur heard.   No friction rub. No gallop.  Pulmonary:      Effort: No respiratory distress.     Breath sounds: No stridor. No wheezing or rhonchi.  Chest:     Chest wall: No tenderness.  Abdominal:     General: Abdomen is flat. Bowel sounds are normal.     Palpations: Abdomen is soft. There is no mass.     Tenderness: There is no abdominal tenderness.  Musculoskeletal:     Cervical back: Normal range of motion and neck supple. No rigidity or tenderness.     Left lower leg: No edema.  Skin:    General: Skin is warm and dry.  Neurological:     Mental Status: He is alert.    Results for orders placed or performed in visit on 05/20/20  PSA  Result Value Ref Range   Prostate Specific Ag, Serum 0.4 0.0 - 4.0 ng/mL  Basic metabolic panel  Result Value Ref Range   Glucose 67 65 - 99 mg/dL   BUN 23 6 - 24 mg/dL   Creatinine, Ser 1.47 (H) 0.76 - 1.27 mg/dL   GFR calc non Af Amer 56 (L) >  59 mL/min/1.73   GFR calc Af Amer 65 >59 mL/min/1.73   BUN/Creatinine Ratio 16 9 - 20   Sodium 139 134 - 144 mmol/L   Potassium 4.3 3.5 - 5.2 mmol/L   Chloride 102 96 - 106 mmol/L   CO2 25 20 - 29 mmol/L   Calcium 9.7 8.7 - 10.2 mg/dL        Current Outpatient Medications:    amLODipine (NORVASC) 5 MG tablet, Take 1 tablet (5 mg total) by mouth daily., Disp: 90 tablet, Rfl: 3   B-D 3CC LUER-LOK SYR 18GX1-1/2 18G X 1-1/2" 3 ML MISC, USE AS DIRECTED WITH TESTOSTERONE, Disp: 12 each, Rfl: 0   finasteride (PROPECIA) 1 MG tablet, Take 1 mg by mouth daily., Disp: , Rfl:    fluticasone (FLONASE) 50 MCG/ACT nasal spray, Place 1 spray into both nostrils daily., Disp: 48 mL, Rfl: 4   testosterone enanthate (DELATESTRYL) 200 MG/ML injection, INJECT 200 MG AS DIRECTED EVERY 14 (FOURTEEN) DAYS., Disp: 5 mL, Rfl: 1   ciclopirox (PENLAC) 8 % solution, Apply topically at bedtime. (Patient not taking: Reported on 12/17/2020), Disp: 6.6 mL, Rfl: 2   Tavaborole (KERYDIN) 5 % SOLN, Apply 1 application topically daily. qhs to toenails (Patient not taking: Reported on 12/17/2020),  Disp: 10 mL, Rfl: 4    Assessment & Plan:  Htn : To stop / taper off of amlodipine.   Medication compliance emphasised. pt advised to keep Bp logs. Pt verbalised understanding of the same. Pt to have a low salt diet . Exercise to reach a goal of at least 150 mins a week.  lifestyle modifications explained and pt understands importance of the above.  2. Low Testosterone : To fu with urology for such  Uses injections for such  Refills sent in until he can be seen. Pt verbalized understanding that he will need to fu with urology for further refills/ mx of such.    Problem List Items Addressed This Visit   None    Orders Placed This Encounter  Procedures   TSH   PSA   Lipid panel   CBC with Differential/Platelet   Comprehensive metabolic panel   Urinalysis, Routine w reflex microscopic   Ambulatory referral to Urology     Meds ordered this encounter  Medications   testosterone enanthate (DELATESTRYL) 200 MG/ML injection    Sig: INJECT 200 MG AS DIRECTED EVERY 14 (FOURTEEN) DAYS.    Dispense:  5 mL    Refill:  1    Not to exceed 4 additional fills before 12/17/2019     Follow up plan: No follow-ups on file.

## 2020-12-17 NOTE — Telephone Encounter (Signed)
Patient was made aware that he need referral to Urology for this refill today at his appt .

## 2020-12-18 LAB — CBC WITH DIFFERENTIAL/PLATELET
Basophils Absolute: 0 10*3/uL (ref 0.0–0.2)
Basos: 0 %
EOS (ABSOLUTE): 0.3 10*3/uL (ref 0.0–0.4)
Eos: 5 %
Hematocrit: 48.2 % (ref 37.5–51.0)
Hemoglobin: 15.7 g/dL (ref 13.0–17.7)
Immature Grans (Abs): 0 10*3/uL (ref 0.0–0.1)
Immature Granulocytes: 0 %
Lymphocytes Absolute: 1.2 10*3/uL (ref 0.7–3.1)
Lymphs: 20 %
MCH: 30.1 pg (ref 26.6–33.0)
MCHC: 32.6 g/dL (ref 31.5–35.7)
MCV: 92 fL (ref 79–97)
Monocytes Absolute: 0.5 10*3/uL (ref 0.1–0.9)
Monocytes: 9 %
Neutrophils Absolute: 3.8 10*3/uL (ref 1.4–7.0)
Neutrophils: 66 %
Platelets: 217 10*3/uL (ref 150–450)
RBC: 5.22 x10E6/uL (ref 4.14–5.80)
RDW: 13.6 % (ref 11.6–15.4)
WBC: 5.8 10*3/uL (ref 3.4–10.8)

## 2020-12-18 LAB — COMPREHENSIVE METABOLIC PANEL
ALT: 31 IU/L (ref 0–44)
AST: 60 IU/L — ABNORMAL HIGH (ref 0–40)
Albumin/Globulin Ratio: 2 (ref 1.2–2.2)
Albumin: 5 g/dL (ref 4.0–5.0)
Alkaline Phosphatase: 41 IU/L — ABNORMAL LOW (ref 44–121)
BUN/Creatinine Ratio: 21 — ABNORMAL HIGH (ref 9–20)
BUN: 28 mg/dL — ABNORMAL HIGH (ref 6–24)
Bilirubin Total: 0.5 mg/dL (ref 0.0–1.2)
CO2: 25 mmol/L (ref 20–29)
Calcium: 9.9 mg/dL (ref 8.7–10.2)
Chloride: 98 mmol/L (ref 96–106)
Creatinine, Ser: 1.31 mg/dL — ABNORMAL HIGH (ref 0.76–1.27)
Globulin, Total: 2.5 g/dL (ref 1.5–4.5)
Glucose: 70 mg/dL (ref 65–99)
Potassium: 4.1 mmol/L (ref 3.5–5.2)
Sodium: 139 mmol/L (ref 134–144)
Total Protein: 7.5 g/dL (ref 6.0–8.5)
eGFR: 67 mL/min/{1.73_m2} (ref >59)

## 2020-12-18 LAB — LIPID PANEL
Chol/HDL Ratio: 3.5 ratio (ref 0.0–5.0)
Cholesterol, Total: 223 mg/dL — ABNORMAL HIGH (ref 100–199)
HDL: 64 mg/dL (ref >39)
LDL Chol Calc (NIH): 148 mg/dL — ABNORMAL HIGH (ref 0–99)
Triglycerides: 64 mg/dL (ref 0–149)
VLDL Cholesterol Cal: 11 mg/dL (ref 5–40)

## 2020-12-18 LAB — PSA: Prostate Specific Ag, Serum: 0.5 ng/mL (ref 0.0–4.0)

## 2020-12-18 LAB — TSH: TSH: 2.04 u[IU]/mL (ref 0.450–4.500)

## 2020-12-18 NOTE — Progress Notes (Signed)
Labs better AST a little better than last visit. reatnine better than last visit but still a little high, needs to increase water intake .pl let pt know thnx. LDL a little high,   work on diet, needs low fat and high fiber diet.

## 2021-01-04 ENCOUNTER — Telehealth: Payer: Self-pay

## 2021-01-04 NOTE — Telephone Encounter (Signed)
Copied from [place] [phone]. Topic: General - Other >> Jan 04, 2021  1:26 PM Yvette Rack wrote: Reason for CRM: Pt stated his pharamcy advised that the testosterone enanthate (DELATESTRYL) 200 MG/ML injection is on back order. Pt requests Rx for Cypionate to be sent to [id number] [place] [place]

## 2021-01-05 ENCOUNTER — Other Ambulatory Visit: Payer: Self-pay

## 2021-01-05 ENCOUNTER — Other Ambulatory Visit: Payer: Self-pay | Admitting: Internal Medicine

## 2021-01-05 MED ORDER — TESTOSTERONE CYPIONATE 200 MG/ML IM SOLN: 200.0000 mg | INTRAMUSCULAR | 0 refills | Status: DC

## 2021-01-05 NOTE — Telephone Encounter (Signed)
Will need to please see urology for such had referred him last visit.

## 2021-01-13 ENCOUNTER — Other Ambulatory Visit: Payer: Self-pay

## 2021-01-13 ENCOUNTER — Encounter: Payer: Self-pay | Admitting: Dermatology

## 2021-01-13 ENCOUNTER — Ambulatory Visit (INDEPENDENT_AMBULATORY_CARE_PROVIDER_SITE_OTHER): Payer: Self-pay | Admitting: Dermatology

## 2021-01-13 DIAGNOSIS — L988 Other specified disorders of the skin and subcutaneous tissue: Secondary | ICD-10-CM

## 2021-01-13 NOTE — Progress Notes (Deleted)
   Follow-Up Visit   Subjective  Dustin [name] is a 50 y.o. male who presents for the following: Facial Elastosis (Botox today to forehead - not treated here but has had Botox in the past).    The following portions of the chart were reviewed this encounter and updated as appropriate:       Review of Systems:  No other skin or systemic complaints except as noted in HPI or Assessment and Plan.  Objective  Well appearing patient in no apparent distress; mood and affect are within normal limits.  A focused examination was performed including face. Relevant physical exam findings are noted in the Assessment and Plan.  Face Rhytides and volume loss.           Assessment & Plan  Elastosis of skin Face  Botox today - units  32.5 units Frown complex 20 units Forehead   Filling material injection - Face Location: See attached image  Informed consent: Discussed risks (infection, pain, bleeding, bruising, swelling, allergic reaction, paralysis of nearby muscles, eyelid droop, double vision, neck weakness, difficulty breathing, headache, undesirable cosmetic result, and need for additional treatment) and benefits of the procedure, as well as the alternatives.  Informed consent was obtained.  Preparation: The area was cleansed with alcohol.  Procedure Details:  Botox was injected into the dermis with a 30-gauge needle. Pressure applied to any bleeding. Ice packs offered for swelling.  Lot Number:  [id number] Expiration:  08/2022  Total Units Injected:  52.5  Plan: Patient was instructed to remain upright for 4 hours. Patient was instructed to avoid massaging the face and avoid vigorous exercise for the rest of the day. Tylenol may be used for headache.  Allow 2 weeks before returning to clinic for additional dosing as needed. Patient will call for any problems.   Return in about 3 weeks (around 02/03/2021) for Botox follow up.  I, Ashok Cordia, CMA, am acting as scribe for  Sarina Ser, MD .

## 2021-01-13 NOTE — Progress Notes (Signed)
   Follow-Up Visit   Subjective  Dustin [name] is a 50 y.o. male who presents for the following: Facial Elastosis (Botox today to forehead - not treated here but has had Botox in the past).  The following portions of the chart were reviewed this encounter and updated as appropriate:   Tobacco  Allergies  Meds  Problems  Med Hx  Surg Hx  Fam Hx     Review of Systems:  No other skin or systemic complaints except as noted in HPI or Assessment and Plan.  Objective  Well appearing patient in no apparent distress; mood and affect are within normal limits.  A focused examination was performed including face. Relevant physical exam findings are noted in the Assessment and Plan.  Face Rhytides and volume loss.             Assessment & Plan  Elastosis of skin Face  Botox today - units  32.5 units Frown complex 20 units Forehead   Filling material injection - Face Location: See attached image  Informed consent: Discussed risks (infection, pain, bleeding, bruising, swelling, allergic reaction, paralysis of nearby muscles, eyelid droop, double vision, neck weakness, difficulty breathing, headache, undesirable cosmetic result, and need for additional treatment) and benefits of the procedure, as well as the alternatives.  Informed consent was obtained.  Preparation: The area was cleansed with alcohol.  Procedure Details:  Botox was injected into the dermis with a 30-gauge needle. Pressure applied to any bleeding. Ice packs offered for swelling.  Lot Number:  [id number] Expiration:  08/2022  Total Units Injected:  52.5  Plan: Patient was instructed to remain upright for 4 hours. Patient was instructed to avoid massaging the face and avoid vigorous exercise for the rest of the day. Tylenol may be used for headache.  Allow 2 weeks before returning to clinic for additional dosing as needed. Patient will call for any problems.   Return in about 3 weeks (around 02/03/2021) for  Botox follow up.  I, Ashok Cordia, CMA, am acting as scribe for Sarina Ser, MD .  Documentation: I have reviewed the above documentation for accuracy and completeness, and I agree with the above.  Sarina Ser, MD

## 2021-01-13 NOTE — Patient Instructions (Signed)

## 2021-02-04 ENCOUNTER — Ambulatory Visit (INDEPENDENT_AMBULATORY_CARE_PROVIDER_SITE_OTHER): Payer: Self-pay | Admitting: Dermatology

## 2021-02-04 ENCOUNTER — Other Ambulatory Visit: Payer: Self-pay

## 2021-02-04 ENCOUNTER — Encounter: Payer: Self-pay | Admitting: Dermatology

## 2021-02-04 DIAGNOSIS — L988 Other specified disorders of the skin and subcutaneous tissue: Secondary | ICD-10-CM

## 2021-02-04 NOTE — Progress Notes (Signed)
   Follow-Up Visit   Subjective  Dustin [name] is a 50 y.o. male who presents for the following: Follow-up (Patient is here today for follow up on botox he received 2 weeks ago at forehead./He reports he is very happy with results. ).  The following portions of the chart were reviewed this encounter and updated as appropriate:  Tobacco  Allergies  Meds  Problems  Med Hx  Surg Hx  Fam Hx      Objective  Well appearing patient in no apparent distress; mood and affect are within normal limits.  A focused examination was performed including face. Relevant physical exam findings are noted in the Assessment and Plan.  Head - Anterior (Face) Rhytides and volume loss.          Assessment & Plan  Elastosis of skin Head - Anterior (Face) Good results from botox injected at forehead and frown complex. Patient pleased with results.  See photos.  Compared to pre-Botox, he has excellent results today. Garry Heater, CMA, am acting as scribe for Sarina Ser, MD.  Return for 1st week in november botox follow up .  Documentation: I have reviewed the above documentation for accuracy and completeness, and I agree with the above.  Sarina Ser, MD

## 2021-02-04 NOTE — Patient Instructions (Signed)

## 2021-03-30 ENCOUNTER — Other Ambulatory Visit: Payer: Self-pay | Admitting: Nurse Practitioner

## 2021-04-13 ENCOUNTER — Ambulatory Visit (INDEPENDENT_AMBULATORY_CARE_PROVIDER_SITE_OTHER): Payer: Self-pay | Admitting: Dermatology

## 2021-04-13 ENCOUNTER — Encounter: Payer: Self-pay | Admitting: Dermatology

## 2021-04-13 ENCOUNTER — Other Ambulatory Visit: Payer: Self-pay

## 2021-04-13 DIAGNOSIS — L988 Other specified disorders of the skin and subcutaneous tissue: Secondary | ICD-10-CM

## 2021-04-13 NOTE — Patient Instructions (Signed)

## 2021-04-13 NOTE — Progress Notes (Signed)
   Follow-Up Visit   Subjective  Dustin [name] is a 50 y.o. male who presents for the following: Facial Elastosis (Patient is here today for Botox injections.).  The following portions of the chart were reviewed this encounter and updated as appropriate:   Tobacco  Allergies  Meds  Problems  Med Hx  Surg Hx  Fam Hx     Review of Systems:  No other skin or systemic complaints except as noted in HPI or Assessment and Plan.  Objective  Well appearing patient in no apparent distress; mood and affect are within normal limits.  A focused examination was performed including the face. Relevant physical exam findings are noted in the Assessment and Plan.  Face Rhytides and volume loss.       Assessment & Plan  Elastosis of skin Face  Botox 52.5 units injected as marked: - Forehead 20 units - Frown complex 32 units   Botox Injection - Face Location: See attached image  Informed consent: Discussed risks (infection, pain, bleeding, bruising, swelling, allergic reaction, paralysis of nearby muscles, eyelid droop, double vision, neck weakness, difficulty breathing, headache, undesirable cosmetic result, and need for additional treatment) and benefits of the procedure, as well as the alternatives.  Informed consent was obtained.  Preparation: The area was cleansed with alcohol.  Procedure Details:  Botox was injected into the dermis with a 30-gauge needle. Pressure applied to any bleeding. Ice packs offered for swelling.  Lot Number:  [id number] Expiration:  12/2022  Total Units Injected:  52.5  Plan: Patient was instructed to remain upright for 4 hours. Patient was instructed to avoid massaging the face and avoid vigorous exercise for the rest of the day. Tylenol may be used for headache.  Allow 2 weeks before returning to clinic for additional dosing as needed. Patient will call for any problems.   Return in about 3 months (around 07/14/2021) for Botox injections.  Luther Redo, CMA, am acting as scribe for Sarina Ser, MD . Documentation: I have reviewed the above documentation for accuracy and completeness, and I agree with the above.  Sarina Ser, MD

## 2021-07-20 ENCOUNTER — Ambulatory Visit: Payer: Self-pay | Admitting: Dermatology

## 2021-08-17 ENCOUNTER — Other Ambulatory Visit: Payer: Self-pay

## 2021-08-17 ENCOUNTER — Encounter: Payer: Self-pay | Admitting: Internal Medicine

## 2021-08-17 ENCOUNTER — Ambulatory Visit (INDEPENDENT_AMBULATORY_CARE_PROVIDER_SITE_OTHER): Payer: 59 | Admitting: Internal Medicine

## 2021-08-17 VITALS — BP 138/85 | HR 74 | Temp 98.0°F | Ht 77.01 in | Wt 221.4 lb

## 2021-08-17 DIAGNOSIS — K120 Recurrent oral aphthae: Secondary | ICD-10-CM | POA: Diagnosis not present

## 2021-08-17 HISTORY — DX: Recurrent oral aphthae: K12.0

## 2021-08-17 MED ORDER — LIDOCAINE VISCOUS HCL 2 % MT SOLN: 5.0000 mL | Freq: Three times a day (TID) | OROMUCOSAL | 1 refills | Status: DC

## 2021-08-17 NOTE — Progress Notes (Signed)
? ?BP 138/85   Pulse 74   Temp 98 ?F (36.7 ?C) (Oral)   Ht 6' 5.01" (1.956 m)   Wt 221 lb 6.4 oz (100.4 kg)   SpO2 97%   BMI 26.25 kg/m?   ? ?Subjective:  ? ? Patient ID: Dustin [name], male    DOB: 10-25-1970, 51 y.o.   MRN: [id number] ? ?Chief Complaint  ?Patient presents with  ?? Bumps  ?  In the middle of his tongue, is getting better but still kept appt.   ? ? ?HPI: ?Dustin [name] is a 51 y.o. male ? ?Mouth Lesions  ?The current episode started 2 days ago. The onset was gradual. The problem occurs frequently. The problem has been gradually improving. The problem is mild. Associated symptoms include mouth sores. Pertinent negatives include no decreased vision, no double vision, no photophobia, no congestion, no ear pain, no headaches, no hearing loss, no rhinorrhea, no sore throat, no stridor, no swollen glands and no eye redness.  ? ?Chief Complaint  ?Patient presents with  ?? Bumps  ?  In the middle of his tongue, is getting better but still kept appt.   ? ? ?Relevant past medical, surgical, family and social history reviewed and updated as indicated. Interim medical history since our last visit reviewed. ?Allergies and medications reviewed and updated. ? ?Review of Systems  ?HENT:  Positive for mouth sores. Negative for congestion, ear pain, hearing loss, rhinorrhea and sore throat.   ?Eyes:  Negative for double vision, photophobia and redness.  ?Respiratory:  Negative for stridor.   ?Neurological:  Negative for headaches.  ? ?Per HPI unless specifically indicated above ? ?   ?Objective:  ?  ?BP 138/85   Pulse 74   Temp 98 ?F (36.7 ?C) (Oral)   Ht 6' 5.01" (1.956 m)   Wt 221 lb 6.4 oz (100.4 kg)   SpO2 97%   BMI 26.25 kg/m?   ?Wt Readings from Last 3 Encounters:  ?08/17/21 221 lb 6.4 oz (100.4 kg)  ?12/17/20 224 lb 9.6 oz (101.9 kg)  ?11/10/20 228 lb (103.4 kg)  ?  ?Physical Exam ?Vitals and nursing note reviewed.  ?Constitutional:   ?   General: He is not in acute distress. ?   Appearance: Normal  appearance. He is not ill-appearing or diaphoretic.  ?HENT:  ?   Head: Normocephalic and atraumatic.  ?   Right Ear: Tympanic membrane and external ear normal. There is no impacted cerumen.  ?   Left Ear: External ear normal.  ?   Nose: No congestion or rhinorrhea.  ?   Mouth/Throat:  ?   Comments: Tongue - apthous ulcers noted on surface of such  ?Eyes:  ?   Conjunctiva/sclera: Conjunctivae normal.  ?   Pupils: Pupils are equal, round, and reactive to light.  ?Musculoskeletal:     ?   General: No swelling, tenderness, deformity or signs of injury.  ?   Cervical back: Normal range of motion and neck supple. No rigidity or tenderness.  ?   Right lower leg: No edema.  ?   Left lower leg: No edema.  ?Neurological:  ?   Mental Status: He is alert.  ? ? ?Results for orders placed or performed in visit on 12/17/20  ?TSH  ?Result Value Ref Range  ? TSH 2.040 0.450 - 4.500 uIU/mL  ?PSA  ?Result Value Ref Range  ? Prostate Specific Ag, Serum 0.5 0.0 - 4.0 ng/mL  ?Lipid panel  ?Result Value  Ref Range  ? Cholesterol, Total 223 (H) 100 - 199 mg/dL  ? Triglycerides 64 0 - 149 mg/dL  ? HDL 64 >39 mg/dL  ? VLDL Cholesterol Cal 11 5 - 40 mg/dL  ? LDL Chol Calc (NIH) 148 (H) 0 - 99 mg/dL  ? Chol/HDL Ratio 3.5 0.0 - 5.0 ratio  ?CBC with Differential/Platelet  ?Result Value Ref Range  ? WBC 5.8 3.4 - 10.8 x10E3/uL  ? RBC 5.22 4.14 - 5.80 x10E6/uL  ? Hemoglobin 15.7 13.0 - 17.7 g/dL  ? Hematocrit 48.2 37.5 - 51.0 %  ? MCV 92 79 - 97 fL  ? MCH 30.1 26.6 - 33.0 pg  ? MCHC 32.6 31.5 - 35.7 g/dL  ? RDW 13.6 11.6 - 15.4 %  ? Platelets 217 150 - 450 x10E3/uL  ? Neutrophils 66 Not Estab. %  ? Lymphs 20 Not Estab. %  ? Monocytes 9 Not Estab. %  ? Eos 5 Not Estab. %  ? Basos 0 Not Estab. %  ? Neutrophils Absolute 3.8 1.4 - 7.0 x10E3/uL  ? Lymphocytes Absolute 1.2 0.7 - 3.1 x10E3/uL  ? Monocytes Absolute 0.5 0.1 - 0.9 x10E3/uL  ? EOS (ABSOLUTE) 0.3 0.0 - 0.4 x10E3/uL  ? Basophils Absolute 0.0 0.0 - 0.2 x10E3/uL  ? Immature Granulocytes 0 Not  Estab. %  ? Immature Grans (Abs) 0.0 0.0 - 0.1 x10E3/uL  ?Comprehensive metabolic panel  ?Result Value Ref Range  ? Glucose 70 65 - 99 mg/dL  ? BUN 28 (H) 6 - 24 mg/dL  ? Creatinine, Ser 1.31 (H) 0.76 - 1.27 mg/dL  ? eGFR 67 >59 mL/min/1.73  ? BUN/Creatinine Ratio 21 (H) 9 - 20  ? Sodium 139 134 - 144 mmol/L  ? Potassium 4.1 3.5 - 5.2 mmol/L  ? Chloride 98 96 - 106 mmol/L  ? CO2 25 20 - 29 mmol/L  ? Calcium 9.9 8.7 - 10.2 mg/dL  ? Total Protein 7.5 6.0 - 8.5 g/dL  ? Albumin 5.0 4.0 - 5.0 g/dL  ? Globulin, Total 2.5 1.5 - 4.5 g/dL  ? Albumin/Globulin Ratio 2.0 1.2 - 2.2  ? Bilirubin Total 0.5 0.0 - 1.2 mg/dL  ? Alkaline Phosphatase 41 (L) 44 - 121 IU/L  ? AST 60 (H) 0 - 40 IU/L  ? ALT 31 0 - 44 IU/L  ?Urinalysis, Routine w reflex microscopic  ?Result Value Ref Range  ? Specific Gravity, UA 1.020 1.005 - 1.030  ? pH, UA 5.5 5.0 - 7.5  ? Color, UA Yellow Yellow  ? Appearance Ur Clear Clear  ? Leukocytes,UA Negative Negative  ? Protein,UA Negative Negative/Trace  ? Glucose, UA Negative Negative  ? Ketones, UA Trace (A) Negative  ? RBC, UA Negative Negative  ? Bilirubin, UA Negative Negative  ? Urobilinogen, Ur 0.2 0.2 - 1.0 mg/dL  ? Nitrite, UA Negative Negative  ? ?   ? ? ?Current Outpatient Medications:  ??  amLODipine (NORVASC) 5 MG tablet, Take 5 mg by mouth daily., Disp: , Rfl:  ??  B-D 3CC LUER-LOK SYR 18GX1-1/2 18G X 1-1/2" 3 ML MISC, USE AS DIRECTED WITH TESTOSTERONE, Disp: 12 each, Rfl: 0 ??  fluticasone (FLONASE) 50 MCG/ACT nasal spray, PLACE 1 SPRAY INTO BOTH NOSTRILS DAILY., Disp: 16 mL, Rfl: 14 ??  magic mouthwash (lidocaine, diphenhydrAMINE, alum & mag hydroxide) suspension, Swish and swallow 5 mLs 3 (three) times daily., Disp: 360 mL, Rfl: 1 ??  testosterone cypionate (DEPOTESTOSTERONE CYPIONATE) 200 MG/ML injection, Inject 1 mL (200 mg total) into  the muscle every 14 (fourteen) days. No further refills will be given, Disp: 2 mL, Rfl: 0  ? ? ?Assessment & Plan:  ?Apthous ulcer on tongue; ?Start pt on  magic mouthwash for ulcer / pain relief  ?To fu with me/ ENT if worsens no ho tobacco abuse ? ? ?Problem List Items Addressed This Visit   ? ?  ? Other  ? Aphthous ulcer - Primary  ?  ? ?No orders of the defined types were placed in this encounter. ?  ? ?Meds ordered this encounter  ?Medications  ?? magic mouthwash (lidocaine, diphenhydrAMINE, alum & mag hydroxide) suspension  ?  Sig: Swish and swallow 5 mLs 3 (three) times daily.  ?  Dispense:  360 mL  ?  Refill:  1  ?  ? ?Follow up plan: ?No follow-ups on file. ? ? ?

## 2021-08-18 ENCOUNTER — Telehealth: Payer: Self-pay | Admitting: Internal Medicine

## 2021-08-18 ENCOUNTER — Encounter: Payer: Self-pay | Admitting: Internal Medicine

## 2021-08-18 NOTE — Telephone Encounter (Signed)
Called the pharmacy. RX was not received. Pharmacist asked for me to leave a VM with information as they were extremely busy with only one pharmacist. Left medication information on the voicemail as instructed,  ? ?Called and notified the patient of the situation and let him know that the medication was called in for him.  ?

## 2021-08-18 NOTE — Telephone Encounter (Signed)
Copied from [place] [phone]. Topic: General - Other ?>> Aug 18, 2021 12:00 PM [place], [place] wrote: ?Reason for CRM:pt's wife called in state was told mouthwash would be sent in for pt. Please call back at [phone] ?

## 2021-11-30 ENCOUNTER — Other Ambulatory Visit: Payer: Self-pay | Admitting: Cardiovascular Disease

## 2021-12-15 ENCOUNTER — Other Ambulatory Visit: Payer: 59

## 2021-12-21 ENCOUNTER — Encounter: Payer: 59 | Admitting: Physician Assistant

## 2021-12-28 ENCOUNTER — Ambulatory Visit (INDEPENDENT_AMBULATORY_CARE_PROVIDER_SITE_OTHER): Payer: 59 | Admitting: Physician Assistant

## 2021-12-28 ENCOUNTER — Encounter: Payer: Self-pay | Admitting: Physician Assistant

## 2021-12-28 VITALS — BP 129/76 | HR 67 | Temp 98.8°F | Ht 77.01 in | Wt 224.4 lb

## 2021-12-28 DIAGNOSIS — Z125 Encounter for screening for malignant neoplasm of prostate: Secondary | ICD-10-CM

## 2021-12-28 DIAGNOSIS — Z0000 Encounter for general adult medical examination without abnormal findings: Secondary | ICD-10-CM

## 2021-12-28 NOTE — Progress Notes (Signed)
Annual Physical Exam    Name: Dustin [name]   MRN: [id number]    DOB: 29-Jun-1970   Date:12/28/2021         Subjective  Chief Complaint  Chief Complaint  Patient presents with   Annual Exam    HPI  Patient presents for annual CPE.   IPSS Questionnaire (AUA-7): Over the past month.   1)  How often have you had a sensation of not emptying your bladder completely after you finish urinating?  0 - Not at all  2)  How often have you had to urinate again less than two hours after you finished urinating? 1 - Less than 1 time in 5  3)  How often have you found you stopped and started again several times when you urinated?  0 - Not at all  4) How difficult have you found it to postpone urination?  0 - Not at all  5) How often have you had a weak urinary stream?  0 - Not at all  6) How often have you had to push or strain to begin urination?  0 - Not at all  7) How many times did you most typically get up to urinate from the time you went to bed until the time you got up in the morning?  1 - 1 time  Total score:  0-7 mildly symptomatic   8-19 moderately symptomatic   20-35 severely symptomatic     Diet: Trying to increase protein. Is engaged in intermittent fasting.  Exercise: Lifting weights 4-5 times per week Sleep:"like a rock" avg of 6-7 hours per night. Feels well rested in the AM Mood:"good, real good"   Depression: phq 9 is negative    12/28/2021    1:41 PM 08/17/2021    4:24 PM 12/17/2020    1:12 PM 11/18/2019    2:11 PM 11/14/2017    1:01 PM  Depression screen PHQ 2/9  Decreased Interest 0 0 0 0 0  Down, Depressed, Hopeless 0 0 0 0 0  PHQ - 2 Score 0 0 0 0 0  Altered sleeping 0 0 0 0   Tired, decreased energy 0 0 0 0   Change in appetite 0 0 0 0   Feeling bad or failure about yourself  0 0 0 0   Trouble concentrating 0 0 0 0   Moving slowly or fidgety/restless 0 0 0 0   Suicidal thoughts 0 0 0 0   PHQ-9 Score 0 0 0 0   Difficult doing work/chores Not difficult at  all Not difficult at all       Hypertension:  BP Readings from Last 3 Encounters:  12/28/21 129/76  08/17/21 138/85  12/17/20 (!) 146/76    Obesity: Wt Readings from Last 3 Encounters:  12/28/21 224 lb 6.4 oz (101.8 kg)  08/17/21 221 lb 6.4 oz (100.4 kg)  12/17/20 224 lb 9.6 oz (101.9 kg)   BMI Readings from Last 3 Encounters:  12/28/21 26.60 kg/m  08/17/21 26.25 kg/m  12/17/20 26.63 kg/m     Lipids:  Lab Results  Component Value Date   CHOL 223 (H) 12/17/2020   CHOL 190 11/18/2019   CHOL 200 (H) 12/26/2018   Lab Results  Component Value Date   HDL 64 12/17/2020   HDL 56 11/18/2019   HDL 49 12/26/2018   Lab Results  Component Value Date   LDLCALC 148 (H) 12/17/2020   LDLCALC 117 (H) 11/18/2019  LDLCALC 136 (H) 12/26/2018   Lab Results  Component Value Date   TRIG 64 12/17/2020   TRIG 94 11/18/2019   TRIG 74 12/26/2018   Lab Results  Component Value Date   CHOLHDL 3.5 12/17/2020   CHOLHDL 4.1 12/26/2018   CHOLHDL 4.9 11/14/2017   No results found for: "LDLDIRECT" Glucose:  Glucose  Date Value Ref Range Status  12/17/2020 70 65 - 99 mg/dL Final  05/20/2020 67 65 - 99 mg/dL Final  11/18/2019 81 65 - 99 mg/dL Final   Glucose, Bld  Date Value Ref Range Status  05/04/2018 104 (H) 70 - 99 mg/dL Final       Married STD testing and prevention (HIV/chl/gon/syphilis):  not applicable Hep C Screening:  Skin cancer: Discussed monitoring for atypical lesions Colorectal cancer: Has never been screened per chart review. Will discuss today.He is amenable to getting this next year. Will place referral for scheduling  Prostate cancer:  yes- recheck today, previous results normal    Lung cancer:  Low Dose CT Chest recommended if Age 77-80 years, 30 pack-year currently smoking OR have quit w/in 15years. Patient  not applicable AAA: The USPSTF recommends one-time screening with ultrasonography in men ages 77 to 79 years who have ever smoked. Patient:  not  applicable ECG:  NA  Vaccines:   HPV: Aged out  Tdap: Up to date. Next due in 2025 Shingrix: Not due yet Pneumonia: Not due yet  Flu: next due in Fall  COVID-19: Not interested in getting this vaccine   Advanced Care Planning: A voluntary discussion about advance care planning including the explanation and discussion of advance directives.  Discussed health care proxy and Living will, and the patient was able to identify a health care proxy as his mother, Timmie Dugue.  Patient does not  have a living will in effect.   Patient Active Problem List   Diagnosis Date Noted   Aphthous ulcer 08/17/2021   Right elbow pain 05/20/2020   Palpitations 12/26/2018   Hypertension    GERD (gastroesophageal reflux disease)    Male hypogonadism     Past Surgical History:  Procedure Laterality Date   TONSILLECTOMY     TYMPANOSTOMY TUBE PLACEMENT     t    Family History  Problem Relation Age of Onset   Thyroid disease Mother    Heart disease Father    Heart disease Paternal Grandfather     Social History   Socioeconomic History   Marital status: Married    Spouse name: Not on file   Number of children: Not on file   Years of education: Not on file   Highest education level: Not on file  Occupational History   Not on file  Tobacco Use   Smoking status: Never   Smokeless tobacco: Never  Vaping Use   Vaping Use: Never used  Substance and Sexual Activity   Alcohol use: Yes   Drug use: No   Sexual activity: Not on file  Other Topics Concern   Not on file  Social History Narrative   Not on file   Social Determinants of Health   Financial Resource Strain: Not on file  Food Insecurity: Not on file  Transportation Needs: Not on file  Physical Activity: Not on file  Stress: Not on file  Social Connections: Not on file  Intimate Partner Violence: Not on file     Current Outpatient Medications:    amLODipine (NORVASC) 5 MG tablet, Take 5 mg by mouth  daily., Disp: , Rfl:     B-D 3CC LUER-LOK SYR 18GX1-1/2 18G X 1-1/2" 3 ML MISC, USE AS DIRECTED WITH TESTOSTERONE, Disp: 12 each, Rfl: 0   fluticasone (FLONASE) 50 MCG/ACT nasal spray, PLACE 1 SPRAY INTO BOTH NOSTRILS DAILY., Disp: 16 mL, Rfl: 14   testosterone cypionate (DEPOTESTOSTERONE CYPIONATE) 200 MG/ML injection, Inject 1 mL (200 mg total) into the muscle every 14 (fourteen) days. No further refills will be given, Disp: 2 mL, Rfl: 0  Allergies  Allergen Reactions   Penicillins Rash     Review of Systems  Constitutional:  Negative for chills, diaphoresis, fever, malaise/fatigue and weight loss.  HENT:  Negative for hearing loss, nosebleeds, sore throat and tinnitus.   Eyes:  Negative for blurred vision, double vision, photophobia and pain.  Respiratory:  Negative for cough, hemoptysis, shortness of breath, wheezing and stridor.   Cardiovascular:  Negative for chest pain, palpitations and leg swelling.  Gastrointestinal:  Negative for blood in stool, constipation, diarrhea, heartburn, nausea and vomiting.  Genitourinary:  Negative for dysuria, frequency, hematuria and urgency.  Musculoskeletal:  Negative for back pain, falls, joint pain, myalgias and neck pain.  Skin:  Negative for itching and rash.  Neurological:  Negative for dizziness, tingling, tremors, loss of consciousness, weakness and headaches.  Psychiatric/Behavioral:  Negative for depression, memory loss and suicidal ideas. The patient is not nervous/anxious and does not have insomnia.        Objective  Vitals:   12/28/21 1337  BP: 129/76  Pulse: 67  Temp: 98.8 F (37.1 C)  TempSrc: Oral  SpO2: 98%  Weight: 224 lb 6.4 oz (101.8 kg)  Height: 6' 5.01" (1.956 m)    Body mass index is 26.6 kg/m.  Physical Exam Vitals reviewed.  Constitutional:      General: He is awake.     Appearance: Normal appearance. He is well-developed, well-groomed and normal weight.  HENT:     Head: Normocephalic and atraumatic.     Mouth/Throat:      Lips: Pink.     Mouth: Mucous membranes are moist.     Pharynx: Oropharynx is clear. Uvula midline. No pharyngeal swelling, oropharyngeal exudate, posterior oropharyngeal erythema or uvula swelling.  Eyes:     General: Lids are normal. Gaze aligned appropriately. No allergic shiner.    Extraocular Movements: Extraocular movements intact.     Right eye: Normal extraocular motion and no nystagmus.     Left eye: Normal extraocular motion and no nystagmus.     Conjunctiva/sclera: Conjunctivae normal.     Pupils: Pupils are equal, round, and reactive to light.  Neck:     Thyroid: No thyroid mass, thyromegaly or thyroid tenderness.  Cardiovascular:     Rate and Rhythm: Normal rate and regular rhythm.     Pulses: Normal pulses.     Heart sounds: Normal heart sounds.  Pulmonary:     Effort: Pulmonary effort is normal.     Breath sounds: Normal breath sounds. No decreased breath sounds, wheezing, rhonchi or rales.  Abdominal:     General: Abdomen is flat. Bowel sounds are normal.     Palpations: Abdomen is soft.     Tenderness: There is no abdominal tenderness.  Musculoskeletal:     Cervical back: Normal range of motion and neck supple.     Right lower leg: No edema.     Left lower leg: No edema.  Lymphadenopathy:     Head:     Right side of head: No submental  or submandibular adenopathy.     Left side of head: No submental or submandibular adenopathy.     Upper Body:     Right upper body: No supraclavicular adenopathy.     Left upper body: No supraclavicular adenopathy.  Neurological:     General: No focal deficit present.     Mental Status: He is alert and oriented to person, place, and time.     Cranial Nerves: Cranial nerves 2-12 are intact. No cranial nerve deficit, dysarthria or facial asymmetry.     Motor: Motor function is intact. No weakness, tremor, atrophy or abnormal muscle tone.     Coordination: Coordination is intact.     Gait: Gait is intact.     Deep Tendon Reflexes:      Reflex Scores:      Tricep reflexes are 1+ on the right side and 1+ on the left side.      Patellar reflexes are 1+ on the right side and 1+ on the left side. Psychiatric:        Attention and Perception: Attention and perception normal.        Mood and Affect: Mood and affect normal.        Speech: Speech normal.        Behavior: Behavior normal. Behavior is cooperative.      No results found for this or any previous visit (from the past 2160 hour(s)).   Fall Risk:    12/28/2021    1:41 PM 08/17/2021    4:24 PM 12/17/2020    1:12 PM 11/18/2019    2:11 PM 11/14/2017    1:01 PM  [place] in the past year? 0 0 0 0 No  Number falls in past yr: 0 0 0 0   Injury with Fall? 0 0 0 0   Risk for fall due to : No Fall Risks No Fall Risks No Fall Risks    Follow up Falls evaluation completed Falls evaluation completed Falls evaluation completed       Functional Status Survey:      Assessment & Plan  Problem List Items Addressed This Visit   None Visit Diagnoses     Annual physical exam    -  Primary  -Prostate cancer screening and PSA options (with potential risks and benefits of testing vs not testing) were discussed along with recent recs/guidelines. -USPSTF grade A and B recommendations reviewed with patient; age-appropriate recommendations, preventive care, screening tests, etc discussed and encouraged; healthy living encouraged; see AVS for patient education given to patient -Discussed importance of 150 minutes of physical activity weekly, eat two servings of fish weekly, eat one serving of tree nuts ( cashews, pistachios, pecans, almonds.Marland Kitchen) every other day, eat 6 servings of fruit/vegetables daily and drink plenty of water and avoid sweet beverages.  -Reviewed Health Maintenance: yes    Relevant Orders   Ambulatory referral to Gastroenterology   Comp Met (CMET)   CBC w/Diff   PSA   Lipid Profile   Screening for prostate cancer       Relevant Orders   PSA         Return in about 6 months (around 06/30/2022) for HTN.   I, Liese Dizdarevic E Athina Fahey, PA-C, have reviewed all documentation for this visit. The documentation on 12/28/21 for the exam, diagnosis, procedures, and orders are all accurate and complete.   Talitha Givens, MHS, PA-C [facility]

## 2021-12-29 LAB — CBC WITH DIFFERENTIAL/PLATELET
Basophils Absolute: 0 10*3/uL (ref 0.0–0.2)
Basos: 1 %
EOS (ABSOLUTE): 0.5 10*3/uL — ABNORMAL HIGH (ref 0.0–0.4)
Eos: 8 %
Hematocrit: 44.3 % (ref 37.5–51.0)
Hemoglobin: 15.1 g/dL (ref 13.0–17.7)
Immature Grans (Abs): 0 10*3/uL (ref 0.0–0.1)
Immature Granulocytes: 0 %
Lymphocytes Absolute: 1.3 10*3/uL (ref 0.7–3.1)
Lymphs: 21 %
MCH: 31.1 pg (ref 26.6–33.0)
MCHC: 34.1 g/dL (ref 31.5–35.7)
MCV: 91 fL (ref 79–97)
Monocytes Absolute: 0.7 10*3/uL (ref 0.1–0.9)
Monocytes: 11 %
Neutrophils Absolute: 3.6 10*3/uL (ref 1.4–7.0)
Neutrophils: 59 %
Platelets: 203 10*3/uL (ref 150–450)
RBC: 4.86 x10E6/uL (ref 4.14–5.80)
RDW: 14.3 % (ref 11.6–15.4)
WBC: 6.1 10*3/uL (ref 3.4–10.8)

## 2021-12-29 LAB — LIPID PANEL
Chol/HDL Ratio: 3.3 ratio (ref 0.0–5.0)
Cholesterol, Total: 176 mg/dL (ref 100–199)
HDL: 53 mg/dL (ref >39)
LDL Chol Calc (NIH): 112 mg/dL — ABNORMAL HIGH (ref 0–99)
Triglycerides: 54 mg/dL (ref 0–149)
VLDL Cholesterol Cal: 11 mg/dL (ref 5–40)

## 2021-12-29 LAB — COMPREHENSIVE METABOLIC PANEL
ALT: 22 IU/L (ref 0–44)
AST: 50 IU/L — ABNORMAL HIGH (ref 0–40)
Albumin/Globulin Ratio: 2 (ref 1.2–2.2)
Albumin: 4.5 g/dL (ref 4.1–5.1)
Alkaline Phosphatase: 41 IU/L — ABNORMAL LOW (ref 44–121)
BUN/Creatinine Ratio: 13 (ref 9–20)
BUN: 16 mg/dL (ref 6–24)
Bilirubin Total: 0.6 mg/dL (ref 0.0–1.2)
CO2: 24 mmol/L (ref 20–29)
Calcium: 9.4 mg/dL (ref 8.7–10.2)
Chloride: 99 mmol/L (ref 96–106)
Creatinine, Ser: 1.28 mg/dL — ABNORMAL HIGH (ref 0.76–1.27)
Globulin, Total: 2.3 g/dL (ref 1.5–4.5)
Glucose: 75 mg/dL (ref 70–99)
Potassium: 4.1 mmol/L (ref 3.5–5.2)
Sodium: 138 mmol/L (ref 134–144)
Total Protein: 6.8 g/dL (ref 6.0–8.5)
eGFR: 69 mL/min/{1.73_m2} (ref >59)

## 2021-12-29 LAB — PSA: Prostate Specific Ag, Serum: 0.7 ng/mL (ref 0.0–4.0)

## 2022-03-30 ENCOUNTER — Other Ambulatory Visit: Payer: Self-pay | Admitting: Cardiovascular Disease

## 2022-03-30 NOTE — Telephone Encounter (Signed)
Please contact pt for future appointment. Pt due for f/u and needing refills.

## 2022-03-31 ENCOUNTER — Telehealth: Payer: Self-pay | Admitting: Cardiovascular Disease

## 2022-03-31 NOTE — Telephone Encounter (Signed)
LM to schedule f/u appt °

## 2023-01-09 ENCOUNTER — Telehealth: Payer: Self-pay | Admitting: Nurse Practitioner

## 2023-01-09 NOTE — Telephone Encounter (Signed)
We place the orders during the apt. The providers here are no longer ordering labs prior to physicals.

## 2023-01-09 NOTE — Telephone Encounter (Signed)
Patient was notified of Erin's recommendations and verbalized understanding.

## 2023-01-09 NOTE — Telephone Encounter (Signed)
Copied from CRM [id number]. Topic: General - Other >> Jan 09, 2023 10:51 AM [place] B wrote: Reason for CRM: pt has a physical appt 10/07 at 2pm. He wants to know if he can come in the day before or before his actual appt to do lab work so it will be ready when he comes for his appt

## 2023-03-20 ENCOUNTER — Ambulatory Visit (INDEPENDENT_AMBULATORY_CARE_PROVIDER_SITE_OTHER): Payer: 59 | Admitting: Nurse Practitioner

## 2023-03-20 ENCOUNTER — Encounter: Payer: Self-pay | Admitting: Nurse Practitioner

## 2023-03-20 VITALS — BP 138/78 | HR 68 | Temp 98.6°F | Ht 78.0 in | Wt 221.4 lb

## 2023-03-20 DIAGNOSIS — Z1211 Encounter for screening for malignant neoplasm of colon: Secondary | ICD-10-CM | POA: Diagnosis not present

## 2023-03-20 DIAGNOSIS — I10 Essential (primary) hypertension: Secondary | ICD-10-CM | POA: Diagnosis not present

## 2023-03-20 DIAGNOSIS — E291 Testicular hypofunction: Secondary | ICD-10-CM | POA: Diagnosis not present

## 2023-03-20 DIAGNOSIS — Z114 Encounter for screening for human immunodeficiency virus [HIV]: Secondary | ICD-10-CM | POA: Diagnosis not present

## 2023-03-20 DIAGNOSIS — Z0000 Encounter for general adult medical examination without abnormal findings: Secondary | ICD-10-CM

## 2023-03-20 DIAGNOSIS — E782 Mixed hyperlipidemia: Secondary | ICD-10-CM | POA: Diagnosis not present

## 2023-03-20 DIAGNOSIS — Z1159 Encounter for screening for other viral diseases: Secondary | ICD-10-CM | POA: Diagnosis not present

## 2023-03-20 LAB — URINALYSIS, ROUTINE W REFLEX MICROSCOPIC
Bilirubin, UA: NEGATIVE
Glucose, UA: NEGATIVE
Ketones, UA: NEGATIVE
Leukocytes,UA: NEGATIVE
Nitrite, UA: NEGATIVE
Protein,UA: NEGATIVE
RBC, UA: NEGATIVE
Specific Gravity, UA: 1.01 (ref 1.005–1.030)
Urobilinogen, Ur: 0.2 mg/dL (ref 0.2–1.0)
pH, UA: 7 (ref 5.0–7.5)

## 2023-03-20 NOTE — Assessment & Plan Note (Signed)
Labs ordered at visit today.  Will make recommendations based on lab results.   

## 2023-03-20 NOTE — Assessment & Plan Note (Signed)
Chronic.  Borderline HTN. Consistently runs in 130s/70s at home.  Labs ordered at visit today.  Will make recommendations based on lab results.

## 2023-03-20 NOTE — Assessment & Plan Note (Signed)
Patient previously seeing Dr. Sheppard Penton.  Would like new referral to establish with new urologist.

## 2023-03-20 NOTE — Progress Notes (Signed)
BP (!) 147/78   Pulse 68   Temp 98.6 F (37 C) (Oral)   Ht 6\' 6"  (1.981 m)   Wt 221 lb 6.4 oz (100.4 kg)   SpO2 98%   BMI 25.59 kg/m    Subjective:    Patient ID: Dustin [name], male    DOB: 06/19/1970, 52 y.o.   MRN: [id number]  HPI: Dustin [name] is a 52 y.o. male presenting on 03/20/2023 for comprehensive medical examination. Current medical complaints include:none  He currently lives with: Interim Problems from his last visit: no  HYPERTENSION without Chronic Kidney Disease Hypertension status: controlled  Satisfied with current treatment? yes Duration of hypertension: years BP monitoring frequency:  daily BP range: 130/70 BP medication side effects:  no Medication compliance: excellent compliance Previous BP meds:none Aspirin: no Recurrent headaches: no Visual changes: no Palpitations: no Dyspnea: no Chest pain: no Lower extremity edema: no Dizzy/lightheaded: no   Depression Screen done today and results listed below:     03/20/2023    1:49 PM 12/28/2021    1:41 PM 08/17/2021    4:24 PM 12/17/2020    1:12 PM 11/18/2019    2:11 PM  Depression screen PHQ 2/9  Decreased Interest 0 0 0 0 0  Down, Depressed, Hopeless 0 0 0 0 0  PHQ - 2 Score 0 0 0 0 0  Altered sleeping 0 0 0 0 0  Tired, decreased energy 0 0 0 0 0  Change in appetite 0 0 0 0 0  Feeling bad or failure about yourself  0 0 0 0 0  Trouble concentrating 0 0 0 0 0  Moving slowly or fidgety/restless 0 0 0 0 0  Suicidal thoughts 0 0 0 0 0  PHQ-9 Score 0 0 0 0 0  Difficult doing work/chores Not difficult at all Not difficult at all Not difficult at all      The patient does not have a history of falls. I did complete a risk assessment for falls. A plan of care for falls was documented.   Past Medical History:  Past Medical History:  Diagnosis Date   Anemia    Aphthous ulcer 08/17/2021   GERD (gastroesophageal reflux disease)    Hypertension    Male hypogonadism    Palpitations 12/26/2018   Right  elbow pain 05/20/2020    Surgical History:  Past Surgical History:  Procedure Laterality Date   TONSILLECTOMY     TYMPANOSTOMY TUBE PLACEMENT     t    Medications:  Current Outpatient Medications on File Prior to Visit  Medication Sig   amLODipine (NORVASC) 5 MG tablet TAKE 1 TABLET (5 MG TOTAL) BY MOUTH DAILY.   B-D 3CC LUER-LOK SYR 18GX1-1/2 18G X 1-1/2" 3 ML MISC USE AS DIRECTED WITH TESTOSTERONE (Patient not taking: Reported on 03/20/2023)   fluticasone (FLONASE) 50 MCG/ACT nasal spray PLACE 1 SPRAY INTO BOTH NOSTRILS DAILY.   testosterone cypionate (DEPOTESTOSTERONE CYPIONATE) 200 MG/ML injection Inject 1 mL (200 mg total) into the muscle every 14 (fourteen) days. No further refills will be given   No current facility-administered medications on file prior to visit.    Allergies:  Allergies  Allergen Reactions   Penicillins Rash    Social History:  Social History   Socioeconomic History   Marital status: Married    Spouse name: Not on file   Number of children: Not on file   Years of education: Not on file   Highest education level: Not  on file  Occupational History   Not on file  Tobacco Use   Smoking status: Never   Smokeless tobacco: Never  Vaping Use   Vaping status: Never Used  Substance and Sexual Activity   Alcohol use: Yes    Alcohol/week: 6.0 standard drinks of alcohol    Types: 6 Standard drinks or equivalent per week   Drug use: No   Sexual activity: Yes    Birth control/protection: None  Other Topics Concern   Not on file  Social History Narrative   Not on file   Social Determinants of Health   Financial Resource Strain: Not on file  Food Insecurity: Not on file  Transportation Needs: Not on file  Physical Activity: Not on file  Stress: Not on file  Social Connections: Not on file  Intimate Partner Violence: Not on file   Social History   Tobacco Use  Smoking Status Never  Smokeless Tobacco Never   Social History   Substance  and Sexual Activity  Alcohol Use Yes   Alcohol/week: 6.0 standard drinks of alcohol   Types: 6 Standard drinks or equivalent per week    Family History:  Family History  Problem Relation Age of Onset   Thyroid disease Mother    Heart disease Father    Heart disease Paternal Grandfather     Past medical history, surgical history, medications, allergies, family history and social history reviewed with patient today and changes made to appropriate areas of the chart.   Review of Systems  Eyes:  Negative for blurred vision and double vision.  Respiratory:  Negative for shortness of breath.   Cardiovascular:  Negative for chest pain, palpitations and leg swelling.  Neurological:  Negative for dizziness and headaches.   All other ROS negative except what is listed above and in the HPI.      Objective:    BP (!) 147/78   Pulse 68   Temp 98.6 F (37 C) (Oral)   Ht 6\' 6"  (1.981 m)   Wt 221 lb 6.4 oz (100.4 kg)   SpO2 98%   BMI 25.59 kg/m   Wt Readings from Last 3 Encounters:  03/20/23 221 lb 6.4 oz (100.4 kg)  12/28/21 224 lb 6.4 oz (101.8 kg)  08/17/21 221 lb 6.4 oz (100.4 kg)    Physical Exam Vitals and nursing note reviewed.  Constitutional:      General: He is not in acute distress.    Appearance: Normal appearance. He is normal weight. He is not ill-appearing, toxic-appearing or diaphoretic.  HENT:     Head: Normocephalic.     Right Ear: Tympanic membrane, ear canal and external ear normal.     Left Ear: Tympanic membrane, ear canal and external ear normal.     Nose: Nose normal. No congestion or rhinorrhea.     Mouth/Throat:     Mouth: Mucous membranes are moist.  Eyes:     General:        Right eye: No discharge.        Left eye: No discharge.     Extraocular Movements: Extraocular movements intact.     Conjunctiva/sclera: Conjunctivae normal.     Pupils: Pupils are equal, round, and reactive to light.  Cardiovascular:     Rate and Rhythm: Normal rate and  regular rhythm.     Heart sounds: No murmur heard. Pulmonary:     Effort: Pulmonary effort is normal. No respiratory distress.     Breath sounds: Normal breath  sounds. No wheezing, rhonchi or rales.  Abdominal:     General: Abdomen is flat. Bowel sounds are normal. There is no distension.     Palpations: Abdomen is soft.     Tenderness: There is no abdominal tenderness. There is no guarding.  Musculoskeletal:     Cervical back: Normal range of motion and neck supple.  Skin:    General: Skin is warm and dry.     Capillary Refill: Capillary refill takes less than 2 seconds.  Neurological:     General: No focal deficit present.     Mental Status: He is alert and oriented to person, place, and time.     Cranial Nerves: No cranial nerve deficit.     Motor: No weakness.     Deep Tendon Reflexes: Reflexes normal.  Psychiatric:        Mood and Affect: Mood normal.        Behavior: Behavior normal.        Thought Content: Thought content normal.        Judgment: Judgment normal.     Results for orders placed or performed in visit on 12/28/21  Comp Met (CMET)  Result Value Ref Range   Glucose 75 70 - 99 mg/dL   BUN 16 6 - 24 mg/dL   Creatinine, Ser 1.61 (H) 0.76 - 1.27 mg/dL   eGFR 69 >09 UE/AVW/0.98   BUN/Creatinine Ratio 13 9 - 20   Sodium 138 134 - 144 mmol/L   Potassium 4.1 3.5 - 5.2 mmol/L   Chloride 99 96 - 106 mmol/L   CO2 24 20 - 29 mmol/L   Calcium 9.4 8.7 - 10.2 mg/dL   Total Protein 6.8 6.0 - 8.5 g/dL   Albumin 4.5 4.1 - 5.1 g/dL   Globulin, Total 2.3 1.5 - 4.5 g/dL   Albumin/Globulin Ratio 2.0 1.2 - 2.2   Bilirubin Total 0.6 0.0 - 1.2 mg/dL   Alkaline Phosphatase 41 (L) 44 - 121 IU/L   AST 50 (H) 0 - 40 IU/L   ALT 22 0 - 44 IU/L  CBC w/Diff  Result Value Ref Range   WBC 6.1 3.4 - 10.8 x10E3/uL   RBC 4.86 4.14 - 5.80 x10E6/uL   Hemoglobin 15.1 13.0 - 17.7 g/dL   Hematocrit 11.9 14.7 - 51.0 %   MCV 91 79 - 97 fL   MCH 31.1 26.6 - 33.0 pg   MCHC 34.1 31.5 -  35.7 g/dL   RDW 82.9 56.2 - 13.0 %   Platelets 203 150 - 450 x10E3/uL   Neutrophils 59 Not Estab. %   Lymphs 21 Not Estab. %   Monocytes 11 Not Estab. %   Eos 8 Not Estab. %   Basos 1 Not Estab. %   Neutrophils Absolute 3.6 1.4 - 7.0 x10E3/uL   Lymphocytes Absolute 1.3 0.7 - 3.1 x10E3/uL   Monocytes Absolute 0.7 0.1 - 0.9 x10E3/uL   EOS (ABSOLUTE) 0.5 (H) 0.0 - 0.4 x10E3/uL   Basophils Absolute 0.0 0.0 - 0.2 x10E3/uL   Immature Granulocytes 0 Not Estab. %   Immature Grans (Abs) 0.0 0.0 - 0.1 x10E3/uL  PSA  Result Value Ref Range   Prostate Specific Ag, Serum 0.7 0.0 - 4.0 ng/mL  Lipid Profile  Result Value Ref Range   Cholesterol, Total 176 100 - 199 mg/dL   Triglycerides 54 0 - 149 mg/dL   HDL 53 >86 mg/dL   VLDL Cholesterol Cal 11 5 - 40 mg/dL   LDL Chol  Calc (NIH) 112 (H) 0 - 99 mg/dL   Chol/HDL Ratio 3.3 0.0 - 5.0 ratio      Assessment & Plan:   Problem List Items Addressed This Visit       Cardiovascular and Mediastinum   Hypertension    Chronic.  Borderline HTN. Consistently runs in 130s/70s at home.  Labs ordered at visit today.  Will make recommendations based on lab results.         Endocrine   Male hypogonadism    Patient previously seeing Dr. Sheppard Penton.  Would like new referral to establish with new urologist.         Other   Mixed hyperlipidemia    Labs ordered at visit today.  Will make recommendations based on lab results.        Relevant Orders   Lipid panel   Other Visit Diagnoses     Annual physical exam    -  Primary   Health maintenance reviewed during visit today.  Labs ordered.  Declined vaccines. Will get Tetanus next year. Cologuard ordered.   Relevant Orders   TSH   PSA   Lipid panel   CBC with Differential/Platelet   Comprehensive metabolic panel   Urinalysis, Routine w reflex microscopic   HIV Antibody (routine testing w rflx)   Hepatitis C Antibody   Screening for HIV (human immunodeficiency virus)       Relevant Orders   HIV  Antibody (routine testing w rflx)   Encounter for hepatitis C screening test for low risk patient       Relevant Orders   Hepatitis C Antibody   Screening for colon cancer       Relevant Orders   Cologuard   Ambulatory referral to Urology        Discussed aspirin prophylaxis for myocardial infarction prevention and decision was it was not indicated  LABORATORY TESTING:  Health maintenance labs ordered today as discussed above.   The natural history of prostate cancer and ongoing controversy regarding screening and potential treatment outcomes of prostate cancer has been discussed with the patient. The meaning of a false positive PSA and a false negative PSA has been discussed. He indicates understanding of the limitations of this screening test and wishes to proceed with screening PSA testing.   IMMUNIZATIONS:   - Tdap: Tetanus vaccination status reviewed: last tetanus booster within 10 years. - Influenza: Refused - Pneumovax: Not applicable - Prevnar: Not applicable - COVID: Not applicable - HPV: Not applicable - Shingrix vaccine: Not applicable  SCREENING: - Colonoscopy: Ordered today  Discussed with patient purpose of the colonoscopy is to detect colon cancer at curable precancerous or early stages   - AAA Screening: Not applicable  -Hearing Test: Not applicable  -Spirometry: Not applicable   PATIENT COUNSELING:    Sexuality: Discussed sexually transmitted diseases, partner selection, use of condoms, avoidance of unintended pregnancy  and contraceptive alternatives.   Advised to avoid cigarette smoking.  I discussed with the patient that most people either abstain from alcohol or drink within safe limits (<=14/week and <=4 drinks/occasion for males, <=7/weeks and <= 3 drinks/occasion for females) and that the risk for alcohol disorders and other health effects rises proportionally with the number of drinks per week and how often a drinker exceeds daily  limits.  Discussed cessation/primary prevention of drug use and availability of treatment for abuse.   Diet: Encouraged to adjust caloric intake to maintain  or achieve ideal body weight, to reduce intake of  dietary saturated fat and total fat, to limit sodium intake by avoiding high sodium foods and not adding table salt, and to maintain adequate dietary potassium and calcium preferably from fresh fruits, vegetables, and low-fat dairy products.    stressed the importance of regular exercise  Injury prevention: Discussed safety belts, safety helmets, smoke detector, smoking near bedding or upholstery.   Dental health: Discussed importance of regular tooth brushing, flossing, and dental visits.   Follow up plan: NEXT PREVENTATIVE PHYSICAL DUE IN 1 YEAR. Return in about 1 year (around 03/19/2024) for Physical and Fasting labs.

## 2023-03-21 LAB — COMPREHENSIVE METABOLIC PANEL
ALT: 35 [IU]/L (ref 0–44)
AST: 72 [IU]/L — ABNORMAL HIGH (ref 0–40)
Albumin: 4.4 g/dL (ref 4.1–5.1)
Alkaline Phosphatase: 40 [IU]/L — ABNORMAL LOW (ref 44–121)
BUN/Creatinine Ratio: 17 (ref 9–20)
BUN: 20 mg/dL (ref 6–24)
Bilirubin Total: 0.6 mg/dL (ref 0.0–1.2)
CO2: 26 mmol/L (ref 20–29)
Calcium: 9.7 mg/dL (ref 8.7–10.2)
Chloride: 99 mmol/L (ref 96–106)
Creatinine, Ser: 1.15 mg/dL (ref 0.76–1.27)
Globulin, Total: 2.6 g/dL (ref 1.5–4.5)
Glucose: 76 mg/dL (ref 70–99)
Potassium: 4 mmol/L (ref 3.5–5.2)
Sodium: 138 mmol/L (ref 134–144)
Total Protein: 7 g/dL (ref 6.0–8.5)
eGFR: 78 mL/min/{1.73_m2} (ref >59)

## 2023-03-21 LAB — CBC WITH DIFFERENTIAL/PLATELET
Basophils Absolute: 0 10*3/uL (ref 0.0–0.2)
Basos: 0 %
EOS (ABSOLUTE): 0.4 10*3/uL (ref 0.0–0.4)
Eos: 6 %
Hematocrit: 50 % (ref 37.5–51.0)
Hemoglobin: 16.4 g/dL (ref 13.0–17.7)
Immature Grans (Abs): 0 10*3/uL (ref 0.0–0.1)
Immature Granulocytes: 0 %
Lymphocytes Absolute: 1.5 10*3/uL (ref 0.7–3.1)
Lymphs: 22 %
MCH: 33.1 pg — ABNORMAL HIGH (ref 26.6–33.0)
MCHC: 32.8 g/dL (ref 31.5–35.7)
MCV: 101 fL — ABNORMAL HIGH (ref 79–97)
Monocytes Absolute: 0.6 10*3/uL (ref 0.1–0.9)
Monocytes: 10 %
Neutrophils Absolute: 4 10*3/uL (ref 1.4–7.0)
Neutrophils: 62 %
Platelets: 216 10*3/uL (ref 150–450)
RBC: 4.95 x10E6/uL (ref 4.14–5.80)
RDW: 13 % (ref 11.6–15.4)
WBC: 6.5 10*3/uL (ref 3.4–10.8)

## 2023-03-21 LAB — LIPID PANEL
Chol/HDL Ratio: 3.3 {ratio} (ref 0.0–5.0)
Cholesterol, Total: 191 mg/dL (ref 100–199)
HDL: 58 mg/dL (ref >39)
LDL Chol Calc (NIH): 121 mg/dL — ABNORMAL HIGH (ref 0–99)
Triglycerides: 63 mg/dL (ref 0–149)
VLDL Cholesterol Cal: 12 mg/dL (ref 5–40)

## 2023-03-21 LAB — PSA: Prostate Specific Ag, Serum: 0.7 ng/mL (ref 0.0–4.0)

## 2023-03-21 LAB — HIV ANTIBODY (ROUTINE TESTING W REFLEX): HIV Screen 4th Generation wRfx: NONREACTIVE

## 2023-03-21 LAB — HEPATITIS C ANTIBODY: Hep C Virus Ab: NONREACTIVE

## 2023-03-21 LAB — TSH: TSH: 2.27 u[IU]/mL (ref 0.450–4.500)

## 2023-03-21 NOTE — Progress Notes (Signed)
HI Dustin [name]. It was nice to meet you yesterday.  Your lab work looks good.  Cholesterol is very similar to prior.  Your cardiac risk score remains in the low risk range which is great.  No need for medications at this time.  Your kidney function showed improvement which is great.  No concerns at this time. Continue with your current medication regimen.  Follow up as discussed.  Please let me know if you have any questions.   The 10-year ASCVD risk score (Arnett DK, et al., 2019) is: 3.7%   Values used to calculate the score:     Age: 52 years     Sex: Male     Is Non-Hispanic African American: No     Diabetic: No     Tobacco smoker: No     Systolic Blood Pressure: 138 mmHg     Is BP treated: Yes     HDL Cholesterol: 58 mg/dL     Total Cholesterol: 191 mg/dL

## 2023-03-26 ENCOUNTER — Encounter: Payer: Self-pay | Admitting: Nurse Practitioner

## 2023-04-04 DIAGNOSIS — Z1211 Encounter for screening for malignant neoplasm of colon: Secondary | ICD-10-CM | POA: Diagnosis not present

## 2023-04-12 LAB — COLOGUARD: COLOGUARD: NEGATIVE

## 2023-06-22 ENCOUNTER — Encounter: Payer: Self-pay | Admitting: Dermatology

## 2023-06-22 ENCOUNTER — Ambulatory Visit: Payer: 59 | Admitting: Dermatology

## 2023-06-22 DIAGNOSIS — W908XXA Exposure to other nonionizing radiation, initial encounter: Secondary | ICD-10-CM

## 2023-06-22 DIAGNOSIS — L814 Other melanin hyperpigmentation: Secondary | ICD-10-CM

## 2023-06-22 DIAGNOSIS — D1801 Hemangioma of skin and subcutaneous tissue: Secondary | ICD-10-CM | POA: Diagnosis not present

## 2023-06-22 DIAGNOSIS — L570 Actinic keratosis: Secondary | ICD-10-CM

## 2023-06-22 DIAGNOSIS — L820 Inflamed seborrheic keratosis: Secondary | ICD-10-CM | POA: Diagnosis not present

## 2023-06-22 DIAGNOSIS — B351 Tinea unguium: Secondary | ICD-10-CM

## 2023-06-22 DIAGNOSIS — Z1283 Encounter for screening for malignant neoplasm of skin: Secondary | ICD-10-CM | POA: Diagnosis not present

## 2023-06-22 DIAGNOSIS — L578 Other skin changes due to chronic exposure to nonionizing radiation: Secondary | ICD-10-CM | POA: Diagnosis not present

## 2023-06-22 DIAGNOSIS — L821 Other seborrheic keratosis: Secondary | ICD-10-CM

## 2023-06-22 DIAGNOSIS — D229 Melanocytic nevi, unspecified: Secondary | ICD-10-CM

## 2023-06-22 MED ORDER — FLUCONAZOLE 200 MG PO TABS
200.0000 mg | ORAL_TABLET | Freq: Every day | ORAL | 0 refills | Status: DC
Start: 2023-06-22 — End: 2024-02-29

## 2023-06-22 MED ORDER — TERBINAFINE HCL 1 % EX CREA
TOPICAL_CREAM | CUTANEOUS | 3 refills | Status: DC
Start: 2023-06-22 — End: 2024-04-30

## 2023-06-22 NOTE — Patient Instructions (Addendum)
 For toenail fungus  Start fluconazole  200 mg - take 1 pill by mouth weekly   Side effects of fluconazole  (diflucan ) include nausea, diarrhea, headache, dizziness, taste changes, rare risk of irritation of the liver, allergy, or decreased blood counts (which could show up as infection or tiredness).   Start nail solution sent to [facility] - apply topically to feet and nails nightly     [facility]  [address] [place], [place] [address]  Phone: [phone]   Seborrheic Keratosis  What causes seborrheic keratoses? Seborrheic keratoses are harmless, common skin growths that first appear during adult life.  As time goes by, more growths appear.  Some people may develop a large number of them.  Seborrheic keratoses appear on both covered and uncovered body parts.  They are not caused by sunlight.  The tendency to develop seborrheic keratoses can be inherited.  They vary in color from skin-colored to gray, brown, or even black.  They can be either smooth or have a rough, warty surface.   Seborrheic keratoses are superficial and look as if they were stuck on the skin.  Under the microscope this type of keratosis looks like layers upon layers of skin.  That is why at times the top layer may seem to fall off, but the rest of the growth remains and re-grows.    Treatment Seborrheic keratoses do not need to be treated, but can easily be removed in the office.  Seborrheic keratoses often cause symptoms when they rub on clothing or jewelry.  Lesions can be in the way of shaving.  If they become inflamed, they can cause itching, soreness, or burning.  Removal of a seborrheic keratosis can be accomplished by freezing, burning, or surgery. If any spot bleeds, scabs, or grows rapidly, please return to have it checked, as these can be an indication of a skin cancer.    [facility]  [address] [place], [place] [address]  Phone: [phone]    Actinic  keratoses are precancerous spots that appear secondary to cumulative UV radiation exposure/sun exposure over time. They are chronic with expected duration over 1 year. A portion of actinic keratoses will progress to squamous cell carcinoma of the skin. It is not possible to reliably predict which spots will progress to skin cancer and so treatment is recommended to prevent development of skin cancer.  Recommend daily broad spectrum sunscreen SPF 30+ to sun-exposed areas, reapply every 2 hours as needed.  Recommend staying in the shade or wearing long sleeves, sun glasses (UVA+UVB protection) and wide brim hats (4-inch brim around the entire circumference of the hat). Call for new or changing lesions.   Cryotherapy Aftercare  Wash gently with soap and water everyday.   Apply Vaseline and Band-Aid daily until healed.     Melanoma ABCDEs  Melanoma is the most dangerous type of skin cancer, and is the leading cause of death from skin disease.  You are more likely to develop melanoma if you: Have light-colored skin, light-colored eyes, or red or blond hair Spend a lot of time in the sun Tan regularly, either outdoors or in a tanning bed Have had blistering sunburns, especially during childhood Have a close family member who has had a melanoma Have atypical moles or large birthmarks  Early detection of melanoma is key since treatment is typically straightforward and cure rates are extremely high if we catch it early.   The first sign of melanoma is often a change in a mole or a  new dark spot.  The ABCDE system is a way of remembering the signs of melanoma.  A for asymmetry:  The two halves do not match. B for border:  The edges of the growth are irregular. C for color:  A mixture of colors are present instead of an even brown color. D for diameter:  Melanomas are usually (but not always) greater than 6mm - the size of a pencil eraser. E for evolution:  The spot keeps changing in size, shape,  and color.  Please check your skin once per month between visits. You can use a small mirror in front and a large mirror behind you to keep an eye on the back side or your body.   If you see any new or changing lesions before your next follow-up, please call to schedule a visit.  Please continue daily skin protection including broad spectrum sunscreen SPF 30+ to sun-exposed areas, reapplying every 2 hours as needed when you're outdoors.   Staying in the shade or wearing long sleeves, sun glasses (UVA+UVB protection) and wide brim hats (4-inch brim around the entire circumference of the hat) are also recommended for sun protection.    Due to recent changes in healthcare laws, you may see results of your pathology and/or laboratory studies on MyChart before the doctors have had a chance to review them. We understand that in some cases there may be results that are confusing or concerning to you. Please understand that not all results are received at the same time and often the doctors may need to interpret multiple results in order to provide you with the best plan of care or course of treatment. Therefore, we ask that you please give us  2 business days to thoroughly review all your results before contacting the office for clarification. Should we see a critical lab result, you will be contacted sooner.   If You Need Anything After Your Visit  If you have any questions or concerns for your doctor, please call our main line at [phone] and press option 4 to reach your doctor's medical assistant. If no one answers, please leave a voicemail as directed and we will return your call as soon as possible. Messages left after 4 pm will be answered the following business day.   You may also send us  a message via MyChart. We typically respond to MyChart messages within 1-2 business days.  For prescription refills, please ask your pharmacy to contact our office. Our fax number is [phone].  If you  have an urgent issue when the clinic is closed that cannot wait until the next business day, you can page your doctor at the number below.    Please note that while we do our best to be available for urgent issues outside of office hours, we are not available 24/7.   If you have an urgent issue and are unable to reach us , you may choose to seek medical care at your doctor's office, retail clinic, urgent care center, or emergency room.  If you have a medical emergency, please immediately call 911 or go to the emergency department.  Pager Numbers  - Dr. Hester: [phone]  - Dr. Jackquline: [phone]  - Dr. Claudene: [phone]   In the event of inclement weather, please call our main line at [phone] for an update on the status of any delays or closures.  Dermatology Medication Tips: Please keep the boxes that topical medications come in in order to help keep track of the instructions  about where and how to use these. Pharmacies typically print the medication instructions only on the boxes and not directly on the medication tubes.   If your medication is too expensive, please contact our office at [phone] option 4 or send us  a message through MyChart.   We are unable to tell what your co-pay for medications will be in advance as this is different depending on your insurance coverage. However, we may be able to find a substitute medication at lower cost or fill out paperwork to get insurance to cover a needed medication.   If a prior authorization is required to get your medication covered by your insurance company, please allow us  1-2 business days to complete this process.  Drug prices often vary depending on where the prescription is filled and some pharmacies may offer cheaper prices.  The website www.goodrx.com contains coupons for medications through different pharmacies. The prices here do not account for what the cost may be with help from insurance (it may be cheaper  with your insurance), but the website can give you the price if you did not use any insurance.  - You can print the associated coupon and take it with your prescription to the pharmacy.  - You may also stop by our office during regular business hours and pick up a GoodRx coupon card.  - If you need your prescription sent electronically to a different pharmacy, notify our office through [facility] or by phone at [phone] option 4.     Si Usted Necesita Algo Despus de Su Visita  Tambin puede enviarnos un mensaje a travs de Clinical Cytogeneticist. Por lo general respondemos a los mensajes de MyChart en el transcurso de 1 a 2 das hbiles.  Para renovar recetas, por favor pida a su farmacia que se ponga en contacto con nuestra oficina. Randi lakes de fax es [place] [phone].  Si tiene un asunto urgente cuando la clnica est cerrada y que no puede esperar hasta el siguiente da hbil, puede llamar/localizar a su doctor(a) al nmero que aparece a continuacin.   Por favor, tenga en cuenta que aunque hacemos todo lo posible para estar disponibles para asuntos urgentes fuera del horario de [place], no estamos disponibles las 24 horas del da, los 7 [address]  [address] de la [place].   Si tiene un problema urgente y no puede comunicarse con nosotros, puede optar por buscar atencin mdica  en el consultorio de su doctor(a), en una clnica privada, en un centro de atencin urgente o en una sala de emergencias.  Si tiene engineer, drilling, por favor llame inmediatamente al 911 o vaya a la sala de emergencias.  Nmeros de bper  - Dr. Hester: [phone]  - Dra. Jackquline: [phone]  - Dr. Claudene: [phone]   En caso de inclemencias del tiempo, por favor llame a landry capes principal al [phone] para una actualizacin sobre el [place] de cualquier retraso o cierre.  Consejos para la medicacin en dermatologa: Por favor, guarde las cajas en las que vienen los medicamentos de uso tpico para  ayudarle a seguir las instrucciones sobre dnde y cmo usarlos. Las farmacias generalmente imprimen las instrucciones del medicamento slo en las cajas y no directamente en los tubos del [place].   Si su medicamento es muy caro, por favor, pngase en contacto con landry rieger llamando al [phone] y presione la opcin 4 o envenos un mensaje a travs de Clinical Cytogeneticist.   No podemos decirle cul ser su copago por los medicamentos por adelantado ya  que esto es diferente dependiendo de la cobertura de su seguro. Sin embargo, es posible que podamos encontrar un medicamento sustituto a audiological scientist un formulario para que el seguro cubra el medicamento que se considera necesario.   Si se requiere una autorizacin previa para que su compaa de seguros cubra su medicamento, por favor permtanos de 1 a 2 das hbiles para completar este proceso.  Los precios de los medicamentos varan con frecuencia dependiendo del environmental consultant de dnde se surte la receta y alguna farmacias pueden ofrecer precios ms baratos.  El sitio web www.goodrx.com tiene cupones para medicamentos de health and safety inspector. Los precios aqu no tienen en cuenta lo que podra costar con la ayuda del seguro (puede ser ms barato con su seguro), pero el sitio web puede darle el precio si no utiliz tourist information centre manager.  - Puede imprimir el cupn correspondiente y llevarlo con su receta a la farmacia.  - Tambin puede pasar por nuestra oficina durante el horario de atencin regular y education officer, museum una tarjeta de cupones de GoodRx.  - Si necesita que su receta se enve electrnicamente a una farmacia diferente, informe a nuestra oficina a travs de MyChart de [place] o por telfono llamando al [phone] y presione la opcin 4.

## 2023-06-22 NOTE — Progress Notes (Signed)
 Follow-Up Visit   Subjective  Dustin [name] is a 53 y.o. male who presents for the following: Skin Cancer Screening and Full Body Skin Exam Reports itchy spot at back  The patient presents for Total-Body Skin Exam (TBSE) for skin cancer screening and mole check. The patient has spots, moles and lesions to be evaluated, some may be new or changing and the patient may have concern these could be cancer.    The following portions of the chart were reviewed this encounter and updated as appropriate: medications, allergies, medical history  Review of Systems:  No other skin or systemic complaints except as noted in HPI or Assessment and Plan.  Objective  Well appearing patient in no apparent distress; mood and affect are within normal limits.  A full examination was performed including scalp, head, eyes, ears, nose, lips, neck, chest, axillae, abdomen, back, buttocks, bilateral upper extremities, bilateral lower extremities, hands, feet, fingers, toes, fingernails, and toenails. All findings within normal limits unless otherwise noted below.   Relevant physical exam findings are noted in the Assessment and Plan.  Left helix ear x 1 , right helix ear x 3 (4) Erythematous thin papules/macules with gritty scale.  left mid back x 1 Erythematous stuck-on, waxy papule or plaque  Assessment & Plan   SKIN CANCER SCREENING PERFORMED TODAY.  ACTINIC DAMAGE - Chronic condition, secondary to cumulative UV/sun exposure - diffuse scaly erythematous macules with underlying dyspigmentation - Recommend daily broad spectrum sunscreen SPF 30+ to sun-exposed areas, reapply every 2 hours as needed.  - Staying in the shade or wearing long sleeves, sun glasses (UVA+UVB protection) and wide brim hats (4-inch brim around the entire circumference of the hat) are also recommended for sun protection.  - Call for new or changing lesions.  LENTIGINES, SEBORRHEIC KERATOSES, HEMANGIOMAS - Benign normal skin  lesions - Benign-appearing - Call for any changes Sk - at left back    MELANOCYTIC NEVI - Tan-brown and/or pink-flesh-colored symmetric macules and papules - Benign appearing on exam today - Observation - Call clinic for new or changing moles - Recommend daily use of broad spectrum spf 30+ sunscreen to sun-exposed areas.    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 Exam: Thickened toenails with subungal debris c/w onychomycosis b/l toenails  Treatment Plan: Patient reports hx of using terbinafine  prescribed by pcp did not help Patient has also tried fluconazole  200 mg which cleared nails but it recurred and kerydin  qhs  Start fluconazole  200 mg capsule - take 1 po weekly x 30 weeks Start 8921 TERBINAFINE  1.7%/ITRACON 1%/KETOCON 1%/CLOTREM 1%/UNDECLYENIC IN TEA TREE OIL 10%DMSO NAIL SOLUTION  Cream - apply to nails nightly  Rx sent to [facility] Can recur due to reexposure from socks shoes towels pets public floors  [facility]  [address] [place], [place] [address]  Phone: [phone]   ONYCHOMYCOSIS   Related Medications fluconazole  (DIFLUCAN ) 200 MG tablet Take 1 tablet (200 mg total) by mouth daily. terbinafine  (LAMISIL ) 1 % cream Apply to nails every night ACTINIC KERATOSIS (4) Left helix ear x 1 , right helix ear x 3 (4) Actinic keratoses are precancerous spots that appear secondary to cumulative UV radiation exposure/sun exposure over time. They are chronic with expected duration over 1 year. A portion of actinic keratoses will progress to squamous cell carcinoma of the skin. It is not possible to reliably predict which spots will progress to skin cancer and so treatment is recommended to prevent development of skin cancer.  Recommend daily broad spectrum sunscreen SPF 30+ to sun-exposed areas, reapply every 2 hours as needed.  Recommend staying in the shade or wearing long sleeves, sun glasses (UVA+UVB protection) and  wide brim hats (4-inch brim around the entire circumference of the hat). Call for new or changing lesions. Destruction of lesion - Left helix ear x 1 , right helix ear x 3 (4) Complexity: simple   Destruction method: cryotherapy   Informed consent: discussed and consent obtained   Timeout:  patient name, date of birth, surgical site, and procedure verified Lesion destroyed using liquid nitrogen: Yes   Region frozen until ice ball extended beyond lesion: Yes   Cryo cycles: 1 or 2. Outcome: patient tolerated procedure well with no complications   Post-procedure details: wound care instructions given   INFLAMED SEBORRHEIC KERATOSIS left mid back x 1 Symptomatic, irritating, patient would like treated. Destruction of lesion - left mid back x 1 Complexity: simple   Destruction method: cryotherapy   Informed consent: discussed and consent obtained   Timeout:  patient name, date of birth, surgical site, and procedure verified Lesion destroyed using liquid nitrogen: Yes   Region frozen until ice ball extended beyond lesion: Yes   Cryo cycles: 1 or 2. Outcome: patient tolerated procedure well with no complications   Post-procedure details: wound care instructions given   MULTIPLE BENIGN NEVI   LENTIGINES   ACTINIC ELASTOSIS   SEBORRHEIC KERATOSES   CHERRY ANGIOMA   Return in about 1 year (around 06/21/2024) for TBSE.  I, Eleanor Blush, CMA, am acting as scribe for Boneta Sharps, MD.   Documentation: I have reviewed the above documentation for accuracy and completeness, and I agree with the above.  Boneta Sharps, MD

## 2023-10-17 DIAGNOSIS — H00022 Hordeolum internum right lower eyelid: Secondary | ICD-10-CM | POA: Diagnosis not present

## 2023-11-30 ENCOUNTER — Ambulatory Visit: Admitting: Dermatology

## 2023-11-30 ENCOUNTER — Encounter: Payer: Self-pay | Admitting: Dermatology

## 2023-11-30 DIAGNOSIS — C4492 Squamous cell carcinoma of skin, unspecified: Secondary | ICD-10-CM | POA: Insufficient documentation

## 2023-11-30 DIAGNOSIS — C44629 Squamous cell carcinoma of skin of left upper limb, including shoulder: Secondary | ICD-10-CM | POA: Diagnosis not present

## 2023-11-30 DIAGNOSIS — D492 Neoplasm of unspecified behavior of bone, soft tissue, and skin: Secondary | ICD-10-CM

## 2023-11-30 HISTORY — DX: Squamous cell carcinoma of skin, unspecified: C44.92

## 2023-11-30 NOTE — Progress Notes (Signed)
   Follow-Up Visit   Subjective  Dustin [name] is a 53 y.o. male who presents for the following: bump, L arm ~92m, neosporin and other abx cream, sore The patient has spots, moles and lesions to be evaluated, some may be new or changing and the patient may have concern these could be cancer.   The following portions of the chart were reviewed this encounter and updated as appropriate: medications, allergies, medical history  Review of Systems:  No other skin or systemic complaints except as noted in HPI or Assessment and Plan.  Objective  Well appearing patient in no apparent distress; mood and affect are within normal limits.   A focused examination was performed of the following areas:   Relevant exam findings are noted in the Assessment and Plan.  L forearm 1.1cm pink ulcerated plaque      Assessment & Plan   NEOPLASM OF SKIN L forearm Epidermal / dermal shaving  Lesion diameter (cm):  1.1 Informed consent: discussed and consent obtained   Timeout: patient name, date of birth, surgical site, and procedure verified   Procedure prep:  Patient was prepped and draped in usual sterile fashion Prep type:  Povidone-iodine and isopropyl alcohol Anesthesia: the lesion was anesthetized in a standard fashion   Anesthetic:  1% lidocaine  w/ epinephrine 1-100,000 buffered w/ 8.4% NaHCO3 Instrument used: DermaBlade   Hemostasis achieved with: pressure and aluminum chloride   Outcome: patient tolerated procedure well   Post-procedure details: wound care instructions given   Specimen 1 - Surgical pathology Differential Diagnosis: SCC vs Prurigo Nodule  Check Margins: yes 1.1cm pink ulcerated plaque Discussed treatment options if skin ca EDC vs excision  Return for as scheduled for TBSE.  I, Rollie Clipper, RMA, am acting as scribe for Harris Liming, MD .   Documentation: I have reviewed the above documentation for accuracy and completeness, and I agree with the  above.  Harris Liming, MD

## 2023-11-30 NOTE — Patient Instructions (Addendum)

## 2023-12-04 ENCOUNTER — Ambulatory Visit: Payer: Self-pay | Admitting: Dermatology

## 2023-12-04 LAB — SURGICAL PATHOLOGY

## 2023-12-05 ENCOUNTER — Encounter: Payer: Self-pay | Admitting: Dermatology

## 2023-12-05 NOTE — Telephone Encounter (Signed)
-----   Message from [place] sent at 12/04/2023  5:10 PM EDT ----- Diagnosis: L forearm :       SQUAMOUS CELL CARCINOMA, KERATOACANTHOMA TYPE, BASE INVOLVED   Please call with diagnosis and determine where the patient would like to have Mohs surgery.  Explanation: This is a squamous cell skin cancer that has grown beyond the surface of the skin and is invading the second layer of the skin. It has the potential to spread beyond the skin and  threaten your health, so I recommend treating it.  Treatment: Given the location and type of skin cancer, I recommend Mohs surgery. Mohs surgery involves cutting out the skin cancer and then checking under the microscope to ensure the whole skin  cancer was removed. If any skin cancer remains, the surgeon will cut out more until it is fully removed. The cure rate is about 98-99%. Once the Mohs surgeon confirms the skin cancer is out, they  will discuss the options to repair or heal the area. You must take it easy for about two weeks after surgery (no lifting over 10-15 lbs, avoid activity to get your heart rate and blood pressure up).  It is done at another office outside of [place] ([place], [place], or [place]). ----- Message ----- From: Interface, Lab In Three Zero One Sent: 12/04/2023   4:58 PM EDT To: Boneta Sharps, MD

## 2023-12-05 NOTE — Telephone Encounter (Signed)
 Called patient and reviewed pathology results with him and recommended treatment is Mohs surgery. Patient wants to know if there are any other treatment options, he said taking it easy and limited activity is impossible for him. I told him I would ask you about other options. Lonell RAMAN., RMA

## 2023-12-05 NOTE — Telephone Encounter (Signed)
 Per Dr. Claudene okay to scheduled [facility]. Discussed with patient. Scheduled for 12/12/2023 at 1::45pm

## 2023-12-12 ENCOUNTER — Encounter: Payer: Self-pay | Admitting: Dermatology

## 2023-12-12 ENCOUNTER — Ambulatory Visit: Admitting: Dermatology

## 2023-12-12 DIAGNOSIS — C4492 Squamous cell carcinoma of skin, unspecified: Secondary | ICD-10-CM

## 2023-12-12 DIAGNOSIS — C44629 Squamous cell carcinoma of skin of left upper limb, including shoulder: Secondary | ICD-10-CM | POA: Diagnosis not present

## 2023-12-12 NOTE — Progress Notes (Signed)
   Follow-Up Visit   Subjective  Dustin [name] is a 53 y.o. male who presents for the following: SCC bx proven, L forearm, pt presents for [facility] The patient has spots, moles and lesions to be evaluated, some may be new or changing and the patient may have concern these could be cancer.   The following portions of the chart were reviewed this encounter and updated as appropriate: medications, allergies, medical history  Review of Systems:  No other skin or systemic complaints except as noted in HPI or Assessment and Plan.  Objective  Well appearing patient in no apparent distress; mood and affect are within normal limits.   A focused examination was performed of the following areas: L forearm  Relevant exam findings are noted in the Assessment and Plan.  L foarearm Pink scaly plaque with central cerebriform irregular pink keratotic papule. Persistent KA post shave removal  Assessment & Plan   SQUAMOUS CELL CARCINOMA OF SKIN L foarearm Destruction of lesion Complexity: simple   Destruction method: electrodesiccation and curettage   Informed consent: discussed and consent obtained   Timeout:  patient name, date of birth, surgical site, and procedure verified Procedure prep:  Patient was prepped and draped in usual sterile fashion Prep type:  Isopropyl alcohol Anesthesia: the lesion was anesthetized in a standard fashion   Anesthetic:  1% lidocaine  w/ epinephrine 1-100,000 local infiltration Curettage performed in three different directions: Yes   Electrodesiccation performed over the curetted area: Yes   Curettage cycles:  3 Final wound size (cm):  1.6 Hemostasis achieved with:  aluminum chloride and electrodesiccation Outcome: patient tolerated procedure well with no complications   Post-procedure details: sterile dressing applied and wound care instructions given   Dressing type: bacitracin and pressure dressing   Bx proven Discussed limitations of EDC, margins not checked,  clinical monitoring for recurrence Discussed if recurrent after EDC will need surgical excision SCC (SQUAMOUS CELL CARCINOMA)    Return in about 3 months (around 03/13/2024) for Recheck SCC EDC site L forearm.  I, Grayce Saunas, RMA, am acting as scribe for Boneta Sharps, MD .   Documentation: I have reviewed the above documentation for accuracy and completeness, and I agree with the above.  Boneta Sharps, MD

## 2023-12-12 NOTE — Patient Instructions (Signed)

## 2024-02-27 ENCOUNTER — Other Ambulatory Visit: Payer: Self-pay | Admitting: Dermatology

## 2024-02-27 DIAGNOSIS — B351 Tinea unguium: Secondary | ICD-10-CM

## 2024-02-28 ENCOUNTER — Encounter: Payer: Self-pay | Admitting: Dermatology

## 2024-02-29 ENCOUNTER — Other Ambulatory Visit: Payer: Self-pay | Admitting: Dermatology

## 2024-02-29 DIAGNOSIS — B351 Tinea unguium: Secondary | ICD-10-CM

## 2024-02-29 MED ORDER — FLUCONAZOLE 200 MG PO TABS: 200.0000 mg | ORAL_TABLET | Freq: Every day | ORAL | 0 refills | Status: AC

## 2024-03-05 DIAGNOSIS — M79672 Pain in left foot: Secondary | ICD-10-CM | POA: Diagnosis not present

## 2024-03-05 DIAGNOSIS — M2042 Other hammer toe(s) (acquired), left foot: Secondary | ICD-10-CM | POA: Diagnosis not present

## 2024-03-05 DIAGNOSIS — M65972 Unspecified synovitis and tenosynovitis, left ankle and foot: Secondary | ICD-10-CM | POA: Diagnosis not present

## 2024-03-14 ENCOUNTER — Encounter: Payer: Self-pay | Admitting: Dermatology

## 2024-03-14 ENCOUNTER — Ambulatory Visit: Admitting: Dermatology

## 2024-03-14 DIAGNOSIS — Z85828 Personal history of other malignant neoplasm of skin: Secondary | ICD-10-CM

## 2024-03-14 DIAGNOSIS — L281 Prurigo nodularis: Secondary | ICD-10-CM | POA: Diagnosis not present

## 2024-03-14 NOTE — Progress Notes (Signed)
   Follow-Up Visit   Subjective  Dustin [name] is a 53 y.o. male who presents for the following: Recheck SCC of L forearm, EDC 12/12/23, check bump L arm   The following portions of the chart were reviewed this encounter and updated as appropriate: medications, allergies, medical history  Review of Systems:  No other skin or systemic complaints except as noted in HPI or Assessment and Plan.  Objective  Well appearing patient in no apparent distress; mood and affect are within normal limits.   A focused examination was performed of the following areas: Left arm  Relevant exam findings are noted in the Assessment and Plan.    Assessment & Plan   HISTORY OF SQUAMOUS CELL CARCINOMA OF THE SKIN - No evidence of recurrence today. Tiny scaly papule on edge of scar at 5 o clock. Suspect reactive, recheck at follow up. Patient RTC if growing - Recommend regular full body skin exams - Recommend daily broad spectrum sunscreen SPF 30+ to sun-exposed areas, reapply every 2 hours as needed.  - Call if any new or changing lesions are noted between office visits - L forearm recheck on f/u  Prurigo nodule L arm Exam: indurated scaly papule L extensor forearm  Folliculitis occurs due to inflammation of the superficial hair follicle (pore), resulting in acne-like lesions (pus bumps). It can be infectious (bacterial, fungal) or noninfectious (shaving, tight clothing, heat/sweat, medications).  Folliculitis can be acute or chronic and recommended treatment depends on the underlying cause of folliculitis.  Treatment Plan: observe   HISTORY OF SCC (SQUAMOUS CELL CARCINOMA) OF SKIN   PRURIGO NODULARIS    Return for as scheduled for TBSE, Hx of SCC, Hx of AKs.  I, Grayce Saunas, RMA, am acting as scribe for Boneta Sharps, MD . '  Documentation: I have reviewed the above documentation for accuracy and completeness, and I agree with the above.  Boneta Sharps, MD

## 2024-03-14 NOTE — Patient Instructions (Signed)

## 2024-03-20 ENCOUNTER — Telehealth: Payer: Self-pay

## 2024-03-20 DIAGNOSIS — Z0000 Encounter for general adult medical examination without abnormal findings: Secondary | ICD-10-CM

## 2024-03-20 DIAGNOSIS — Z136 Encounter for screening for cardiovascular disorders: Secondary | ICD-10-CM

## 2024-03-20 NOTE — Telephone Encounter (Signed)
 Copied from CRM #[id number]. Topic: Clinical - Request for Lab/Test Order >> Mar 20, 2024  1:02 PM Shanda MATSU wrote: Reason for CRM: Patient is req that orders for labs that need to be done for the physical he has scheduled on 10/28 can be placed in his chart so that he can have labs done the morning of his appt rather than having to fast the whole day since his appt is at 3pm. Patient is also req orders for PSA.

## 2024-03-21 ENCOUNTER — Encounter: Payer: Self-pay | Admitting: Nurse Practitioner

## 2024-03-21 NOTE — Telephone Encounter (Signed)
 Routing to provider- would you be ok with the patient doing this? If so, please place future orders. Patient also requesting PSA to be checked.

## 2024-03-22 NOTE — Telephone Encounter (Signed)
 Orders placed.

## 2024-03-22 NOTE — Telephone Encounter (Signed)
 Scheduled

## 2024-03-22 NOTE — Telephone Encounter (Signed)
 Lab orders have been placed. Please call and schedule lab appointment for patient.

## 2024-03-25 ENCOUNTER — Encounter: Admitting: Nurse Practitioner

## 2024-04-08 NOTE — Telephone Encounter (Unsigned)
 Copied from CRM [id number]. Topic: Clinical - Request for Lab/Test Order >> Apr 08, 2024 11:03 AM Ahlexyia S wrote: Reason for CRM: Pt would like to have a GGT (liver), Cystatin C (cholesterol) and Apo B (kidney) labs done. Pt had an appointment scheduled for Oct 28th for labs but pt wanted to have these labs done at that appointment. Contacted [facility] to confirm and was told the orders will need to be put in and a additional appointment will be needed once those orders are in. Informed pt with info and pt requested the appointment for tomorrow to be cancelled. Pt would like to be rescheduled when those orders are in.

## 2024-04-09 ENCOUNTER — Other Ambulatory Visit

## 2024-04-09 ENCOUNTER — Encounter: Admitting: Nurse Practitioner

## 2024-04-11 ENCOUNTER — Telehealth: Payer: Self-pay | Admitting: Nurse Practitioner

## 2024-04-11 NOTE — Telephone Encounter (Signed)
 Copied from CRM [id number]. Topic: Clinical - Request for Lab/Test Order >> Apr 11, 2024 10:33 AM Wess RAMAN wrote: Reason for CRM: Pt would like to have a GGT (liver), Cystatin C (cholesterol) and Apo B (kidney) labs done and he would like a call when they have been uploaded to his chart so he can schedule labs.  Callback #: [phone]

## 2024-04-11 NOTE — Telephone Encounter (Signed)
 Lab orders have already been placed for the patient. Please call and schedule lab appointment.

## 2024-04-11 NOTE — Telephone Encounter (Signed)
 Scheduled

## 2024-04-16 ENCOUNTER — Other Ambulatory Visit

## 2024-04-16 ENCOUNTER — Encounter: Payer: Self-pay | Admitting: Nurse Practitioner

## 2024-04-16 DIAGNOSIS — Z0000 Encounter for general adult medical examination without abnormal findings: Secondary | ICD-10-CM

## 2024-04-30 ENCOUNTER — Encounter: Payer: Self-pay | Admitting: Nurse Practitioner

## 2024-04-30 ENCOUNTER — Other Ambulatory Visit

## 2024-04-30 ENCOUNTER — Ambulatory Visit (INDEPENDENT_AMBULATORY_CARE_PROVIDER_SITE_OTHER): Admitting: Nurse Practitioner

## 2024-04-30 VITALS — BP 130/70 | HR 76 | Temp 97.7°F | Ht 77.56 in | Wt 215.2 lb

## 2024-04-30 DIAGNOSIS — E782 Mixed hyperlipidemia: Secondary | ICD-10-CM | POA: Diagnosis not present

## 2024-04-30 DIAGNOSIS — Z0000 Encounter for general adult medical examination without abnormal findings: Secondary | ICD-10-CM

## 2024-04-30 DIAGNOSIS — Z136 Encounter for screening for cardiovascular disorders: Secondary | ICD-10-CM

## 2024-04-30 DIAGNOSIS — R030 Elevated blood-pressure reading, without diagnosis of hypertension: Secondary | ICD-10-CM | POA: Insufficient documentation

## 2024-04-30 NOTE — Assessment & Plan Note (Signed)
 Chronic.  Controlled.  Continue with current medication regimen.  Labs ordered today.  Return to clinic in 6 months for reevaluation.  Call sooner if concerns arise.  ? ?

## 2024-04-30 NOTE — Assessment & Plan Note (Signed)
 Elevated in office.  Consistently 130/70's at home.  Return to clinic if blood pressure is >140/90 at home.

## 2024-04-30 NOTE — Progress Notes (Signed)
 BP 130/70 Comment: home blood pressure reading  Pulse 76   Temp 97.7 F (36.5 C) (Oral)   Ht 6' 5.56 (1.97 m)   Wt 215 lb 3.2 oz (97.6 kg)   SpO2 97%   BMI 25.15 kg/m    Subjective:    Patient ID: Dustin [name], male    DOB: 1970/07/09, 53 y.o.   MRN: [id number]  HPI: Dustin [name] is a 53 y.o. male presenting on 04/30/2024 for comprehensive medical examination. Current medical complaints include:none  He currently lives with: Interim Problems from his last visit: no  HYPERTENSION without Chronic Kidney Disease Hypertension status: controlled  Satisfied with current treatment? yes Duration of hypertension: years BP monitoring frequency:  daily BP range: 130/70 BP medication side effects:  no Medication compliance: excellent compliance Previous BP meds:none Aspirin: no Recurrent headaches: no Visual changes: no Palpitations: no Dyspnea: no Chest pain: no Lower extremity edema: no Dizzy/lightheaded: no   Depression Screen done today and results listed below:     04/30/2024    3:03 PM 03/20/2023    1:49 PM 12/28/2021    1:41 PM 08/17/2021    4:24 PM 12/17/2020    1:12 PM  Depression screen PHQ 2/9  Decreased Interest 0 0 0 0 0  Down, Depressed, Hopeless 0 0 0 0 0  PHQ - 2 Score 0 0 0 0 0  Altered sleeping 0 0 0 0 0  Tired, decreased energy 0 0 0 0 0  Change in appetite 0 0 0 0 0  Feeling bad or failure about yourself  0 0 0 0 0  Trouble concentrating 0 0 0 0 0  Moving slowly or fidgety/restless 0 0 0 0 0  Suicidal thoughts 0 0 0 0 0  PHQ-9 Score 0 0  0  0  0   Difficult doing work/chores Not difficult at all Not difficult at all Not difficult at all Not difficult at all      Data saved with a previous flowsheet row definition    The patient does not have a history of falls. I did complete a risk assessment for falls. A plan of care for falls was documented.   Past Medical History:  Past Medical History:  Diagnosis Date   Actinic keratosis    Anemia     Aphthous ulcer 08/17/2021   GERD (gastroesophageal reflux disease)    Hypertension    Male hypogonadism    Palpitations 12/26/2018   Right elbow pain 05/20/2020   Squamous cell carcinoma of skin 11/30/2023   Left forearm. KA-type. [facility] 12/12/2023.    Surgical History:  Past Surgical History:  Procedure Laterality Date   TONSILLECTOMY     TYMPANOSTOMY TUBE PLACEMENT     t    Medications:  Current Outpatient Medications on File Prior to Visit  Medication Sig   fluconazole  (DIFLUCAN ) 200 MG tablet Take 1 tablet (200 mg total) by mouth daily.   No current facility-administered medications on file prior to visit.    Allergies:  Allergies  Allergen Reactions   Penicillins Rash    Social History:  Social History   Socioeconomic History   Marital status: Married    Spouse name: Not on file   Number of children: Not on file   Years of education: Not on file   Highest education level: 12th grade  Occupational History   Not on file  Tobacco Use   Smoking status: Never   Smokeless tobacco: Never  Vaping Use  Vaping status: Never Used  Substance and Sexual Activity   Alcohol use: Yes    Alcohol/week: 6.0 standard drinks of alcohol    Types: 6 Standard drinks or equivalent per week   Drug use: No   Sexual activity: Yes    Birth control/protection: None  Other Topics Concern   Not on file  Social History Narrative   Not on file   Social Drivers of Health   Financial Resource Strain: Low Risk  (04/26/2024)   Overall Financial Resource Strain (CARDIA)    Difficulty of Paying Living Expenses: Not hard at all  Food Insecurity: Patient Declined (04/26/2024)   Hunger Vital Sign    Worried About Programme Researcher, Broadcasting/film/video in the Last Year: Patient declined    Ran Out of Food in the Last Year: Patient declined  Transportation Needs: Patient Declined (04/26/2024)   PRAPARE - Administrator, Civil Service (Medical): Patient declined    Lack of Transportation  (Non-Medical): Patient declined  Physical Activity: Not on file  Stress: Not on file  Social Connections: Unknown (04/26/2024)   Social Connection and Isolation Panel    Frequency of Communication with Friends and Family: Patient declined    Frequency of Social Gatherings with Friends and Family: Patient declined    Attends Religious Services: Patient declined    Database Administrator or Organizations: No    Attends Engineer, Structural: Not on file    Marital Status: Patient declined  Catering Manager Violence: Not on file   Social History   Tobacco Use  Smoking Status Never  Smokeless Tobacco Never   Social History   Substance and Sexual Activity  Alcohol Use Yes   Alcohol/week: 6.0 standard drinks of alcohol   Types: 6 Standard drinks or equivalent per week    Family History:  Family History  Problem Relation Age of Onset   Thyroid disease Mother    Heart disease Father    Heart disease Paternal Grandfather     Past medical history, surgical history, medications, allergies, family history and social history reviewed with patient today and changes made to appropriate areas of the chart.   Review of Systems  Eyes:  Negative for blurred vision and double vision.  Respiratory:  Negative for shortness of breath.   Cardiovascular:  Negative for chest pain, palpitations and leg swelling.  Neurological:  Negative for dizziness and headaches.   All other ROS negative except what is listed above and in the HPI.      Objective:    BP 130/70 Comment: home blood pressure reading  Pulse 76   Temp 97.7 F (36.5 C) (Oral)   Ht 6' 5.56 (1.97 m)   Wt 215 lb 3.2 oz (97.6 kg)   SpO2 97%   BMI 25.15 kg/m   Wt Readings from Last 3 Encounters:  04/30/24 215 lb 3.2 oz (97.6 kg)  03/20/23 221 lb 6.4 oz (100.4 kg)  12/28/21 224 lb 6.4 oz (101.8 kg)    Physical Exam Vitals and nursing note reviewed.  Constitutional:      General: He is not in acute distress.     Appearance: Normal appearance. He is normal weight. He is not ill-appearing, toxic-appearing or diaphoretic.  HENT:     Head: Normocephalic.     Right Ear: Tympanic membrane, ear canal and external ear normal.     Left Ear: Tympanic membrane, ear canal and external ear normal.     Nose: Nose normal. No congestion  or rhinorrhea.     Mouth/Throat:     Mouth: Mucous membranes are moist.  Eyes:     General:        Right eye: No discharge.        Left eye: No discharge.     Extraocular Movements: Extraocular movements intact.     Conjunctiva/sclera: Conjunctivae normal.     Pupils: Pupils are equal, round, and reactive to light.  Cardiovascular:     Rate and Rhythm: Normal rate and regular rhythm.     Heart sounds: No murmur heard. Pulmonary:     Effort: Pulmonary effort is normal. No respiratory distress.     Breath sounds: Normal breath sounds. No wheezing, rhonchi or rales.  Abdominal:     General: Abdomen is flat. Bowel sounds are normal. There is no distension.     Palpations: Abdomen is soft.     Tenderness: There is no abdominal tenderness. There is no guarding.  Musculoskeletal:     Cervical back: Normal range of motion and neck supple.  Skin:    General: Skin is warm and dry.     Capillary Refill: Capillary refill takes less than 2 seconds.  Neurological:     General: No focal deficit present.     Mental Status: He is alert and oriented to person, place, and time.     Cranial Nerves: No cranial nerve deficit.     Motor: No weakness.     Deep Tendon Reflexes: Reflexes normal.  Psychiatric:        Mood and Affect: Mood normal.        Behavior: Behavior normal.        Thought Content: Thought content normal.        Judgment: Judgment normal.     Results for orders placed or performed in visit on 11/30/23  Surgical pathology   Collection Time: 11/30/23 12:00 AM  Result Value Ref Range   SURGICAL PATHOLOGY      SURGICAL PATHOLOGY [facility] [address] [place], [place] [address] Telephone [phone] or [phone] Fax [phone]  REPORT OF DERMATOPATHOLOGY   Accession #: [id number] Patient Name: ARGUS, CARAHER Visit # : [id number]  MRN: [id number] Cytotechnologist: Ephriam Rolla Edelman, Dermatopathologist, Electronic Signature DOB/Age [birth year] (Age: 33) Gender: M Collected Date: 11/30/2023 Received Date: 11/30/2023  FINAL DIAGNOSIS       1. Skin, L forearm :       SQUAMOUS CELL CARCINOMA, KERATOACANTHOMA TYPE, BASE INVOLVED       DATE SIGNED OUT: 12/04/2023 ELECTRONIC SIGNATURE : Depcik-Smith Md, Natalie, Dermatopathologist, Electronic Signature  MICROSCOPIC DESCRIPTION 1. There is a proliferation of atypical squamous epithelial cells with a crateriform pattern.  An inflammatory reaction is noted.  These are the features of a well differentiated squamous cell carcinoma, keratoacanthoma type.  The process extends to th e base making it difficult to exclude deeper involvement.   This lesion extends at least 1.5 mm to the deep margin.  CASE COMMENTS STAINS USED IN DIAGNOSIS: H&E    CLINICAL HISTORY  SPECIMEN(S) OBTAINED 1. Skin, L Forearm  SPECIMEN COMMENTS: 1. 1.1cm pink ulcerated plaque, check margins SPECIMEN CLINICAL INFORMATION: 1. Neoplasm of skin, SCC vs prurigo nodule    Gross Description 1. Formalin fixed specimen received:  12 X 12 X 2 MM, TOTO (4 P) (1 B) ( erh ) margins inked.        Report signed out from the following location(s) [facility] [address] [place], [place] [address] CLIA #: [id number]  [facility] [address] [place], [place] [address] CLIA #: [id number]       Assessment & Plan:   Problem List Items Addressed This Visit       Other   Mixed hyperlipidemia   Chronic.  Controlled.  Continue with current medication regimen.  Labs ordered today.  Return to clinic in 6 months for reevaluation.   Call sooner if concerns arise.        Elevated blood pressure reading   Elevated in office.  Consistently 130/70's at home.  Return to clinic if blood pressure is >140/90 at home.        Other Visit Diagnoses       Annual physical exam    -  Primary   Health maintenance reviewed during visit today.  Labs ordered.  Vaccines reviewed.  Cologuard up to date.         Discussed aspirin prophylaxis for myocardial infarction prevention and decision was it was not indicated  LABORATORY TESTING:  Health maintenance labs ordered today as discussed above.   The natural history of prostate cancer and ongoing controversy regarding screening and potential treatment outcomes of prostate cancer has been discussed with the patient. The meaning of a false positive PSA and a false negative PSA has been discussed. He indicates understanding of the limitations of this screening test and wishes to proceed with screening PSA testing.   IMMUNIZATIONS:   - Tdap: Tetanus vaccination status reviewed: last tetanus booster within 10 years. - Influenza: Refused - Pneumovax: Not applicable - Prevnar: Not applicable - COVID: Not applicable - HPV: Not applicable - Shingrix vaccine: Not applicable  SCREENING: - Colonoscopy: Ordered today  Discussed with patient purpose of the colonoscopy is to detect colon cancer at curable precancerous or early stages   - AAA Screening: Not applicable  -Hearing Test: Not applicable  -Spirometry: Not applicable   PATIENT COUNSELING:    Sexuality: Discussed sexually transmitted diseases, partner selection, use of condoms, avoidance of unintended pregnancy  and contraceptive alternatives.   Advised to avoid cigarette smoking.  I discussed with the patient that most people either abstain from alcohol or drink within safe limits (<=14/week and <=4 drinks/occasion for males, <=7/weeks and <= 3 drinks/occasion for females) and that the risk for alcohol disorders and other  health effects rises proportionally with the number of drinks per week and how often a drinker exceeds daily limits.  Discussed cessation/primary prevention of drug use and availability of treatment for abuse.   Diet: Encouraged to adjust caloric intake to maintain  or achieve ideal body weight, to reduce intake of dietary saturated fat and total fat, to limit sodium intake by avoiding high sodium foods and not adding table salt, and to maintain adequate dietary potassium and calcium preferably from fresh fruits, vegetables, and low-fat dairy products.    stressed the importance of regular exercise  Injury prevention: Discussed safety belts, safety helmets, smoke detector, smoking near bedding or upholstery.   Dental health: Discussed importance of regular tooth brushing, flossing, and dental visits.   Follow up plan: NEXT PREVENTATIVE PHYSICAL DUE IN 1 YEAR. Return in about 1 year (around 04/30/2025) for Physical and Fasting labs.

## 2024-05-02 ENCOUNTER — Ambulatory Visit: Payer: Self-pay | Admitting: Nurse Practitioner

## 2024-05-02 ENCOUNTER — Encounter: Payer: Self-pay | Admitting: Nurse Practitioner

## 2024-05-02 DIAGNOSIS — E782 Mixed hyperlipidemia: Secondary | ICD-10-CM

## 2024-05-02 DIAGNOSIS — R748 Abnormal levels of other serum enzymes: Secondary | ICD-10-CM

## 2024-05-02 DIAGNOSIS — D751 Secondary polycythemia: Secondary | ICD-10-CM

## 2024-05-02 LAB — COMPREHENSIVE METABOLIC PANEL WITH GFR
ALT: 37 IU/L (ref 0–44)
AST: 86 IU/L — ABNORMAL HIGH (ref 0–40)
Albumin: 4.5 g/dL (ref 3.8–4.9)
Alkaline Phosphatase: 45 IU/L — ABNORMAL LOW (ref 47–123)
BUN/Creatinine Ratio: 18 (ref 9–20)
BUN: 20 mg/dL (ref 6–24)
Bilirubin Total: 0.7 mg/dL (ref 0.0–1.2)
CO2: 23 mmol/L (ref 20–29)
Calcium: 9.5 mg/dL (ref 8.7–10.2)
Chloride: 99 mmol/L (ref 96–106)
Creatinine, Ser: 1.13 mg/dL (ref 0.76–1.27)
Globulin, Total: 2.7 g/dL (ref 1.5–4.5)
Glucose: 89 mg/dL (ref 70–99)
Potassium: 4.1 mmol/L (ref 3.5–5.2)
Sodium: 137 mmol/L (ref 134–144)
Total Protein: 7.2 g/dL (ref 6.0–8.5)
eGFR: 79 mL/min/1.73 (ref >59)

## 2024-05-02 LAB — CBC WITH DIFFERENTIAL/PLATELET
Basophils Absolute: 0 x10E3/uL (ref 0.0–0.2)
Basos: 0 %
EOS (ABSOLUTE): 0.3 x10E3/uL (ref 0.0–0.4)
Eos: 5 %
Hematocrit: 55.5 % — ABNORMAL HIGH (ref 37.5–51.0)
Hemoglobin: 18.7 g/dL — ABNORMAL HIGH (ref 13.0–17.7)
Immature Grans (Abs): 0 x10E3/uL (ref 0.0–0.1)
Immature Granulocytes: 0 %
Lymphocytes Absolute: 1.3 x10E3/uL (ref 0.7–3.1)
Lymphs: 22 %
MCH: 35.1 pg — ABNORMAL HIGH (ref 26.6–33.0)
MCHC: 33.7 g/dL (ref 31.5–35.7)
MCV: 104 fL — ABNORMAL HIGH (ref 79–97)
Monocytes Absolute: 0.6 x10E3/uL (ref 0.1–0.9)
Monocytes: 10 %
Neutrophils Absolute: 3.6 x10E3/uL (ref 1.4–7.0)
Neutrophils: 63 %
Platelets: 175 x10E3/uL (ref 150–450)
RBC: 5.33 x10E6/uL (ref 4.14–5.80)
RDW: 12.2 % (ref 11.6–15.4)
WBC: 5.8 x10E3/uL (ref 3.4–10.8)

## 2024-05-02 LAB — LIPID PANEL
Chol/HDL Ratio: 5.1 ratio — ABNORMAL HIGH (ref 0.0–5.0)
Cholesterol, Total: 251 mg/dL — ABNORMAL HIGH (ref 100–199)
HDL: 49 mg/dL (ref >39)
LDL Chol Calc (NIH): 182 mg/dL — ABNORMAL HIGH (ref 0–99)
Triglycerides: 114 mg/dL (ref 0–149)
VLDL Cholesterol Cal: 20 mg/dL (ref 5–40)

## 2024-05-02 LAB — MAGNESIUM: Magnesium: 2.3 mg/dL (ref 1.6–2.3)

## 2024-05-02 LAB — INSULIN, RANDOM: INSULIN: 1.7 u[IU]/mL — ABNORMAL LOW (ref 2.6–24.9)

## 2024-05-02 LAB — PSA: Prostate Specific Ag, Serum: 0.7 ng/mL (ref 0.0–4.0)

## 2024-05-02 LAB — HOMOCYSTEINE: Homocysteine: 10.9 umol/L (ref 0.0–14.5)

## 2024-05-02 LAB — C-REACTIVE PROTEIN: CRP: <1 mg/L (ref 0–10)

## 2024-05-02 LAB — TSH: TSH: 1.56 u[IU]/mL (ref 0.450–4.500)

## 2024-05-28 ENCOUNTER — Other Ambulatory Visit

## 2024-05-28 DIAGNOSIS — E782 Mixed hyperlipidemia: Secondary | ICD-10-CM

## 2024-05-28 DIAGNOSIS — D751 Secondary polycythemia: Secondary | ICD-10-CM

## 2024-05-31 ENCOUNTER — Ambulatory Visit: Payer: Self-pay | Admitting: Nurse Practitioner

## 2024-05-31 LAB — CBC WITH DIFFERENTIAL/PLATELET
Basophils Absolute: 0 x10E3/uL (ref 0.0–0.2)
Basos: 0 %
EOS (ABSOLUTE): 0.3 x10E3/uL (ref 0.0–0.4)
Eos: 5 %
Hematocrit: 51.4 % — ABNORMAL HIGH (ref 37.5–51.0)
Hemoglobin: 17.5 g/dL (ref 13.0–17.7)
Immature Grans (Abs): 0 x10E3/uL (ref 0.0–0.1)
Immature Granulocytes: 0 %
Lymphocytes Absolute: 1.4 x10E3/uL (ref 0.7–3.1)
Lymphs: 24 %
MCH: 35.1 pg — ABNORMAL HIGH (ref 26.6–33.0)
MCHC: 34 g/dL (ref 31.5–35.7)
MCV: 103 fL — ABNORMAL HIGH (ref 79–97)
Monocytes Absolute: 0.6 x10E3/uL (ref 0.1–0.9)
Monocytes: 11 %
Neutrophils Absolute: 3.6 x10E3/uL (ref 1.4–7.0)
Neutrophils: 60 %
Platelets: 183 x10E3/uL (ref 150–450)
RBC: 4.98 x10E6/uL (ref 4.14–5.80)
RDW: 12.4 % (ref 11.6–15.4)
WBC: 5.9 x10E3/uL (ref 3.4–10.8)

## 2024-05-31 LAB — LIPOPROTEIN A (LPA): Lipoprotein (a): <8.4 nmol/L

## 2024-06-24 ENCOUNTER — Ambulatory Visit: Payer: 59 | Admitting: Dermatology

## 2024-06-24 ENCOUNTER — Encounter: Payer: Self-pay | Admitting: Dermatology

## 2024-06-24 DIAGNOSIS — L578 Other skin changes due to chronic exposure to nonionizing radiation: Secondary | ICD-10-CM

## 2024-06-24 DIAGNOSIS — D229 Melanocytic nevi, unspecified: Secondary | ICD-10-CM

## 2024-06-24 DIAGNOSIS — Z1283 Encounter for screening for malignant neoplasm of skin: Secondary | ICD-10-CM | POA: Diagnosis not present

## 2024-06-24 DIAGNOSIS — L814 Other melanin hyperpigmentation: Secondary | ICD-10-CM

## 2024-06-24 DIAGNOSIS — W908XXA Exposure to other nonionizing radiation, initial encounter: Secondary | ICD-10-CM

## 2024-06-24 DIAGNOSIS — D1801 Hemangioma of skin and subcutaneous tissue: Secondary | ICD-10-CM

## 2024-06-24 DIAGNOSIS — Z85828 Personal history of other malignant neoplasm of skin: Secondary | ICD-10-CM

## 2024-06-24 DIAGNOSIS — L739 Follicular disorder, unspecified: Secondary | ICD-10-CM

## 2024-06-24 DIAGNOSIS — D485 Neoplasm of uncertain behavior of skin: Secondary | ICD-10-CM

## 2024-06-24 DIAGNOSIS — L821 Other seborrheic keratosis: Secondary | ICD-10-CM

## 2024-06-24 NOTE — Progress Notes (Signed)
 "  Follow-Up Visit   Subjective  Dustin [name] is a 54 y.o. male who presents for the following: Skin Cancer Screening and Full Body Skin Exam  The patient presents for Total-Body Skin Exam (TBSE) for skin cancer screening and mole check. The patient has spots, moles and lesions to be evaluated, some may be new or changing and the patient may have concern these could be cancer.  Hx of SCC. Few spots at face.   The following portions of the chart were reviewed this encounter and updated as appropriate: medications, allergies, medical history  Review of Systems:  No other skin or systemic complaints except as noted in HPI or Assessment and Plan.  Objective  Well appearing patient in no apparent distress; mood and affect are within normal limits.  A full examination was performed including scalp, head, eyes, ears, nose, lips, neck, chest, axillae, abdomen, back, buttocks, bilateral upper extremities, bilateral lower extremities, hands, feet, fingers, toes, fingernails, and toenails. All findings within normal limits unless otherwise noted below.   Relevant physical exam findings are noted in the Assessment and Plan.  left chest 4 mm irregular pink macule   Assessment & Plan   SKIN CANCER SCREENING PERFORMED TODAY.  ACTINIC DAMAGE - Chronic condition, secondary to cumulative UV/sun exposure - diffuse scaly erythematous macules with underlying dyspigmentation - Recommend daily broad spectrum sunscreen SPF 30+ to sun-exposed areas, reapply every 2 hours as needed.  - Staying in the shade or wearing long sleeves, sun glasses (UVA+UVB protection) and wide brim hats (4-inch brim around the entire circumference of the hat) are also recommended for sun protection.  - Call for new or changing lesions.  LENTIGINES, SEBORRHEIC KERATOSES, HEMANGIOMAS - Benign normal skin lesions - Benign-appearing - Call for any changes  MELANOCYTIC NEVI - Tan-brown and/or pink-flesh-colored symmetric  macules and papules - Benign appearing on exam today - Observation - Call clinic for new or changing moles - Recommend daily use of broad spectrum spf 30+ sunscreen to sun-exposed areas.   HISTORY OF SQUAMOUS CELL CARCINOMA OF THE SKIN - left forearm, EDC 12/12/2023 - No evidence of recurrence today - No lymphadenopathy - Recommend regular full body skin exams - Recommend daily broad spectrum sunscreen SPF 30+ to sun-exposed areas, reapply every 2 hours as needed.  - Call if any new or changing lesions are noted between office visits  FOLLICULITIS Exam: follicular erythematous papule at posterior scalp  Folliculitis occurs due to inflammation of the superficial hair follicle (pore), resulting in acne-like lesions (pus bumps). It can be infectious (bacterial, fungal) or noninfectious (shaving, tight clothing, heat/sweat, medications).  Folliculitis can be acute or chronic and recommended treatment depends on the underlying cause of folliculitis.  Treatment Plan: None   Patient asked with his hx of SCC and now a possible BCC, would there be any concern for him to take IGF1 growth hormone angiogenesis agent. Will send mychart NEOPLASM OF UNCERTAIN BEHAVIOR OF SKIN left chest - Skin / nail biopsy Type of biopsy: tangential   Informed consent: discussed and consent obtained   Timeout: patient name, date of birth, surgical site, and procedure verified   Procedure prep:  Patient was prepped and draped in usual sterile fashion Prep type:  Isopropyl alcohol Anesthesia: the lesion was anesthetized in a standard fashion   Anesthetic:  1% lidocaine  w/ epinephrine 1-100,000 buffered w/ 8.4% NaHCO3 Instrument used: DermaBlade   Hemostasis achieved with: pressure and aluminum chloride   Outcome: patient tolerated procedure well   Post-procedure  details: sterile dressing applied and wound care instructions given   Dressing type: bandage and petrolatum    Specimen 1 - Surgical  pathology Differential Diagnosis: BCC  Check Margins: No MULTIPLE BENIGN NEVI   LENTIGINES   ACTINIC ELASTOSIS   SEBORRHEIC KERATOSES   CHERRY ANGIOMA   FOLLICULITIS   Return in about 6 months (around 12/22/2024) for TBSE, HxSCC, with Dr. Claudene.  LILLETTE Lonell Drones, RMA, am acting as scribe for Boneta Claudene, MD .   Documentation: I have reviewed the above documentation for accuracy and completeness, and I agree with the above.  Boneta Claudene, MD   "

## 2024-06-24 NOTE — Patient Instructions (Addendum)
 Biopsy Wound Care Instructions  Leave the original bandage on for 24 hours if possible.  If the bandage becomes soaked or soiled before that time, it is OK to remove it and examine the wound.  A small amount of post-operative bleeding is normal.  If excessive bleeding occurs, remove the bandage, place gauze over the site and apply continuous pressure (no peeking) over the area for 30 minutes. If this does not work, please call our clinic as soon as possible or page your doctor if it is after hours.   Once a day, cleanse the wound with soap and water. It is fine to shower. If a thick crust develops you may use a Q-tip dipped into dilute hydrogen peroxide (mix 1:1 with water) to dissolve it.  Hydrogen peroxide can slow the healing process, so use it only as needed.    After washing, apply petroleum jelly (Vaseline) or an antibiotic ointment if your doctor prescribed one for you, followed by a bandage.    For best healing, the wound should be covered with a layer of ointment at all times. If you are not able to keep the area covered with a bandage to hold the ointment in place, this may mean re-applying the ointment several times a day.  Continue this wound care until the wound has healed and is no longer open.   Itching and mild discomfort is normal during the healing process. However, if you develop pain or severe itching, please call our office.   If you have any discomfort, you can take Tylenol (acetaminophen) or ibuprofen as directed on the bottle. (Please do not take these if you have an allergy to them or cannot take them for another reason).  Some redness, tenderness and white or yellow material in the wound is normal healing.  If the area becomes very sore and red, or develops a thick yellow-green material (pus), it may be infected; please notify us .    If you have stitches, return to clinic as directed to have the stitches removed. You will continue wound care for 2-3 days after the stitches  are removed.   Wound healing continues for up to one year following surgery. It is not unusual to experience pain in the scar from time to time during the interval.  If the pain becomes severe or the scar thickens, you should notify the office.    A slight amount of redness in a scar is expected for the first six months.  After six months, the redness will fade and the scar will soften and fade.  The color difference becomes less noticeable with time.  If there are any problems, return for a post-op surgery check at your earliest convenience.  To improve the appearance of the scar, you can use silicone scar gel, cream, or sheets (such as Mederma or Serica) every night for up to one year. These are available over the counter (without a prescription).  Please call our office at [phone] for any questions or concerns.    Melanoma ABCDEs  Melanoma is the most dangerous type of skin cancer, and is the leading cause of death from skin disease.  You are more likely to develop melanoma if you: Have light-colored skin, light-colored eyes, or red or blond hair Spend a lot of time in the sun Tan regularly, either outdoors or in a tanning bed Have had blistering sunburns, especially during childhood Have a close family member who has had a melanoma Have atypical moles or large birthmarks  Early detection of melanoma is key since treatment is typically straightforward and cure rates are extremely high if we catch it early.   The first sign of melanoma is often a change in a mole or a new dark spot.  The ABCDE system is a way of remembering the signs of melanoma.  A for asymmetry:  The two halves do not match. B for border:  The edges of the growth are irregular. C for color:  A mixture of colors are present instead of an even brown color. D for diameter:  Melanomas are usually (but not always) greater than 6mm - the size of a pencil eraser. E for evolution:  The spot keeps changing in size,  shape, and color.  Please check your skin once per month between visits. You can use a small mirror in front and a large mirror behind you to keep an eye on the back side or your body.   If you see any new or changing lesions before your next follow-up, please call to schedule a visit.  Please continue daily skin protection including broad spectrum sunscreen SPF 30+ to sun-exposed areas, reapplying every 2 hours as needed when you're outdoors.    Due to recent changes in healthcare laws, you may see results of your pathology and/or laboratory studies on MyChart before the doctors have had a chance to review them. We understand that in some cases there may be results that are confusing or concerning to you. Please understand that not all results are received at the same time and often the doctors may need to interpret multiple results in order to provide you with the best plan of care or course of treatment. Therefore, we ask that you please give us  2 business days to thoroughly review all your results before contacting the office for clarification. Should we see a critical lab result, you will be contacted sooner.   If You Need Anything After Your Visit  If you have any questions or concerns for your doctor, please call our main line at [phone] and press option 4 to reach your doctor's medical assistant. If no one answers, please leave a voicemail as directed and we will return your call as soon as possible. Messages left after 4 pm will be answered the following business day.   You may also send us  a message via MyChart. We typically respond to MyChart messages within 1-2 business days.  For prescription refills, please ask your pharmacy to contact our office. Our fax number is [phone].  If you have an urgent issue when the clinic is closed that cannot wait until the next business day, you can page your doctor at the number below.    Please note that while we do our best to be  available for urgent issues outside of office hours, we are not available 24/7.   If you have an urgent issue and are unable to reach us , you may choose to seek medical care at your doctor's office, retail clinic, urgent care center, or emergency room.  If you have a medical emergency, please immediately call 911 or go to the emergency department.  Pager Numbers  - Dr. Hester: [phone]  - Dr. Jackquline: [phone]  - Dr. Claudene: [phone]   - Dr. Raymund: [phone]  In the event of inclement weather, please call our main line at [phone] for an update on the status of any delays or closures.  Dermatology Medication Tips: Please keep the boxes that topical medications come  in in order to help keep track of the instructions about where and how to use these. Pharmacies typically print the medication instructions only on the boxes and not directly on the medication tubes.   If your medication is too expensive, please contact our office at [phone] option 4 or send us  a message through MyChart.   We are unable to tell what your co-pay for medications will be in advance as this is different depending on your insurance coverage. However, we may be able to find a substitute medication at lower cost or fill out paperwork to get insurance to cover a needed medication.   If a prior authorization is required to get your medication covered by your insurance company, please allow us  1-2 business days to complete this process.  Drug prices often vary depending on where the prescription is filled and some pharmacies may offer cheaper prices.  The website www.goodrx.com contains coupons for medications through different pharmacies. The prices here do not account for what the cost may be with help from insurance (it may be cheaper with your insurance), but the website can give you the price if you did not use any insurance.  - You can print the associated coupon and take it with your  prescription to the pharmacy.  - You may also stop by our office during regular business hours and pick up a GoodRx coupon card.  - If you need your prescription sent electronically to a different pharmacy, notify our office through [facility] or by phone at [phone] option 4.     Si Usted Necesita Algo Despus de Su Visita  Tambin puede enviarnos un mensaje a travs de Clinical Cytogeneticist. Por lo general respondemos a los mensajes de MyChart en el transcurso de 1 a 2 das hbiles.  Para renovar recetas, por favor pida a su farmacia que se ponga en contacto con nuestra oficina. Randi lakes de fax es [place] [phone].  Si tiene un asunto urgente cuando la clnica est cerrada y que no puede esperar hasta el siguiente da hbil, puede llamar/localizar a su doctor(a) al nmero que aparece a continuacin.   Por favor, tenga en cuenta que aunque hacemos todo lo posible para estar disponibles para asuntos urgentes fuera del horario de [place], no estamos disponibles las 24 horas del da, los 7 [address]  [address] de la [place].   Si tiene un problema urgente y no puede comunicarse con nosotros, puede optar por buscar atencin mdica  en el consultorio de su doctor(a), en una clnica privada, en un centro de atencin urgente o en una sala de emergencias.  Si tiene engineer, drilling, por favor llame inmediatamente al 911 o vaya a la sala de emergencias.  Nmeros de bper  - Dr. Hester: [phone]  - Dra. Jackquline: [phone]  - Dr. Claudene: [phone]  - Dra. Kitts: [phone]  En caso de inclemencias del [place], por favor llame a nuestra lnea principal al [phone] para una actualizacin sobre el estado de cualquier retraso o cierre.  Consejos para la medicacin en dermatologa: Por favor, guarde las cajas en las que vienen los medicamentos de uso tpico para ayudarle a seguir las instrucciones sobre dnde y cmo usarlos. Las farmacias generalmente imprimen las instrucciones del  medicamento slo en las cajas y no directamente en los tubos del [place].   Si su medicamento es muy caro, por favor, pngase en contacto con landry rieger llamando al [phone] y presione la opcin 4 o envenos un mensaje a travs de Clinical Cytogeneticist.  No podemos decirle cul ser su copago por los medicamentos por adelantado ya que esto es diferente dependiendo de la cobertura de su seguro. Sin embargo, es posible que podamos encontrar un medicamento sustituto a audiological scientist un formulario para que el seguro cubra el medicamento que se considera necesario.   Si se requiere una autorizacin previa para que su compaa de seguros cubra su medicamento, por favor permtanos de 1 a 2 das hbiles para completar este proceso.  Los precios de los medicamentos varan con frecuencia dependiendo del environmental consultant de dnde se surte la receta y alguna farmacias pueden ofrecer precios ms baratos.  El sitio web www.goodrx.com tiene cupones para medicamentos de health and safety inspector. Los precios aqu no tienen en cuenta lo que podra costar con la ayuda del seguro (puede ser ms barato con su seguro), pero el sitio web puede darle el precio si no utiliz tourist information centre manager.  - Puede imprimir el cupn correspondiente y llevarlo con su receta a la farmacia.  - Tambin puede pasar por nuestra oficina durante el horario de atencin regular y education officer, museum una tarjeta de cupones de GoodRx.  - Si necesita que su receta se enve electrnicamente a una farmacia diferente, informe a nuestra oficina a travs de MyChart de [place] o por telfono llamando al [phone] y presione la opcin 4.

## 2024-06-25 ENCOUNTER — Encounter: Payer: Self-pay | Admitting: Dermatology

## 2024-06-26 LAB — SURGICAL PATHOLOGY

## 2024-06-27 ENCOUNTER — Ambulatory Visit: Payer: Self-pay | Admitting: Dermatology

## 2024-12-24 ENCOUNTER — Ambulatory Visit: Admitting: Dermatology

## 2025-05-05 ENCOUNTER — Encounter: Admitting: Nurse Practitioner
